# Patient Record
Sex: Male | Born: 1945 | Race: Black or African American | Hispanic: No | Marital: Married | Smoking: Never smoker
Health system: Southern US, Community
[De-identification: ages and names within clinical notes are randomized; demographics above are authoritative.]

## PROBLEM LIST (undated history)

## (undated) DIAGNOSIS — I10 Essential (primary) hypertension: Secondary | ICD-10-CM

## (undated) DIAGNOSIS — E119 Type 2 diabetes mellitus without complications: Secondary | ICD-10-CM

## (undated) HISTORY — PX: CORONARY ANGIOPLASTY WITH STENT PLACEMENT: SHX49

---

## 2003-04-01 ENCOUNTER — Ambulatory Visit (HOSPITAL_COMMUNITY): Admission: RE | Admit: 2003-04-01 | Discharge: 2003-04-01 | Payer: Self-pay | Admitting: Gastroenterology

## 2003-05-06 ENCOUNTER — Ambulatory Visit (HOSPITAL_COMMUNITY): Admission: RE | Admit: 2003-05-06 | Discharge: 2003-05-06 | Payer: Self-pay | Admitting: Internal Medicine

## 2003-05-14 ENCOUNTER — Encounter: Payer: Self-pay | Admitting: Cardiovascular Disease

## 2003-05-14 ENCOUNTER — Ambulatory Visit (HOSPITAL_COMMUNITY): Admission: RE | Admit: 2003-05-14 | Discharge: 2003-05-15 | Payer: Self-pay | Admitting: Cardiovascular Disease

## 2004-12-24 ENCOUNTER — Ambulatory Visit (HOSPITAL_COMMUNITY): Admission: RE | Admit: 2004-12-24 | Discharge: 2004-12-25 | Payer: Self-pay | Admitting: Cardiovascular Disease

## 2005-05-19 ENCOUNTER — Encounter: Admission: RE | Admit: 2005-05-19 | Discharge: 2005-05-19 | Payer: Self-pay | Admitting: Internal Medicine

## 2005-11-10 ENCOUNTER — Ambulatory Visit (HOSPITAL_BASED_OUTPATIENT_CLINIC_OR_DEPARTMENT_OTHER): Admission: RE | Admit: 2005-11-10 | Discharge: 2005-11-10 | Payer: Self-pay | Admitting: Urology

## 2005-11-10 ENCOUNTER — Ambulatory Visit (HOSPITAL_COMMUNITY): Admission: RE | Admit: 2005-11-10 | Discharge: 2005-11-10 | Payer: Self-pay | Admitting: Urology

## 2006-02-25 ENCOUNTER — Ambulatory Visit: Payer: Self-pay | Admitting: Internal Medicine

## 2008-04-05 ENCOUNTER — Ambulatory Visit: Payer: Self-pay | Admitting: Gastroenterology

## 2008-05-24 ENCOUNTER — Ambulatory Visit: Payer: Self-pay | Admitting: Gastroenterology

## 2008-05-24 ENCOUNTER — Encounter: Payer: Self-pay | Admitting: Gastroenterology

## 2008-05-29 ENCOUNTER — Encounter: Payer: Self-pay | Admitting: Gastroenterology

## 2009-09-02 ENCOUNTER — Encounter: Admission: RE | Admit: 2009-09-02 | Discharge: 2009-09-02 | Payer: Self-pay | Admitting: Urology

## 2009-09-03 ENCOUNTER — Ambulatory Visit (HOSPITAL_BASED_OUTPATIENT_CLINIC_OR_DEPARTMENT_OTHER): Admission: RE | Admit: 2009-09-03 | Discharge: 2009-09-04 | Payer: Self-pay | Admitting: Urology

## 2011-03-26 LAB — URINALYSIS, ROUTINE W REFLEX MICROSCOPIC
Bilirubin Urine: NEGATIVE
Glucose, UA: NEGATIVE mg/dL
Nitrite: NEGATIVE
Specific Gravity, Urine: 1.025 (ref 1.005–1.030)
pH: 5 (ref 5.0–8.0)

## 2011-03-26 LAB — CBC
MCV: 89.6 fL (ref 78.0–100.0)
Platelets: 299 10*3/uL (ref 150–400)
RBC: 4.36 MIL/uL (ref 4.22–5.81)
WBC: 6.2 10*3/uL (ref 4.0–10.5)

## 2011-03-26 LAB — COMPREHENSIVE METABOLIC PANEL
ALT: 16 U/L (ref 0–53)
AST: 32 U/L (ref 0–37)
Albumin: 4 g/dL (ref 3.5–5.2)
Chloride: 102 mEq/L (ref 96–112)
Creatinine, Ser: 1.35 mg/dL (ref 0.4–1.5)
GFR calc Af Amer: >60 mL/min (ref >60)
Sodium: 137 mEq/L (ref 135–145)
Total Bilirubin: 0.5 mg/dL (ref 0.3–1.2)

## 2011-03-26 LAB — GLUCOSE, CAPILLARY
Glucose-Capillary: 148 mg/dL — ABNORMAL HIGH (ref 70–99)
Glucose-Capillary: 115 mg/dL — ABNORMAL HIGH (ref 70–99)
Glucose-Capillary: 140 mg/dL — ABNORMAL HIGH (ref 70–99)

## 2011-03-26 LAB — APTT: aPTT: 28 seconds (ref 24–37)

## 2011-05-07 NOTE — Cardiovascular Report (Signed)
NAME:  GHASSAN, COGGESHALL NO.:  [id number]   MEDICAL RECORD NO.:  [id number]                   PATIENT TYPE:  OIB   LOCATION:  6531                                 FACILITY:  [facility]   PHYSICIAN:  Richard A. Alanda Amass, M.D.          DATE OF BIRTH:  Aug 31, 1946   DATE OF PROCEDURE:  05/14/2003  DATE OF DISCHARGE:                              CARDIAC CATHETERIZATION   PROCEDURE:  Retrograde central aorta catheterization, selective coronary  angiography by Judkins technique, IC nitroglycerin administration, LV  angiogram RAO/LAO projection, subselective LIMA/RIMA, abdominal angiogram  hand injection, weight adjustment heparin, Plavix 600 mg p.o., Aggrastat  bolus plus infusion, recannulization, chronic total, proximal codominant  circumflex stenosis, tandem stenting, mid circumflex segmental stenosis,  PTCA side branch large OM 2 stenosis.   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  He has a history of possible hyperlipidemia, past smoking abuse  for over 20 years, but quit 2004.  A long history dating back at least one  to two years or more of intermittent reflux system with pyrosis.  No GI  bleeding.  Recent colonoscopy with subsequent hemorrhoidectomy and polyp  removal and no GI bleeding.   The patient had an episode of epigastric and lower sternal chest discomfort  with mowing the lawn several weeks ago.  He felt that this was probably GI  related.  He stopped mowing and slowed down and then it recurred with some  mild shortness of breath.   He saw Erskine Speed, M.D. nonemergently and was concerned about this  exertional history.  The patient underwent outpatient treadmill exercise  testing with good functional capacity and no ST segment depression.  However, he had lower chest and epigastric discomfort post exercise.  For  this reason  with his history he was referred to [place] for further evaluation.  Treadmill was done May 06, 2003.  A called for patient and saw him in the  office on May 06, 2003.  He was not having any chest discomfort and felt  good.  We are concerned about his history.  I recommended the patient  undergo diagnostic cardiac catheterization.  He was begun on Lopressor,  continued on aspirin, and started on Nexium.  Because of home and work  considerations the patient declined admission that day or last week for  angiography and it was scheduled for today.  He was able to work but we had  him at otherwise limited activity and he knew to call Dr. Chilton Si and [place] if  there were any symptoms.  He did well, was admitted at the same day  admission, today.   He was brought to the second floor CP laboratory in postabsorptive state  after 5 mg of Valium p.o. pre medication.  The right groin was prepped,  draped in the  usual manner and the CRLFA was entered with single anterior  puncture using an 18 thin wall needle with a 6-French short-based slide-on  sheath inserted without difficulty.  Diagnostic coronary angiography was  done with 6-French 4 cm taper of preform coronary and pigtail catheters  using Omnipaque dye throughout the procedure.  IC nitroglycerin was  administered with repeat left coronary injections __________.  LV angiogram  was done in the RAO and LAO projection 25 mL 14 mL per second, 20 mL 12  mL/second, respectively.  Pullback pressure of the CA was performed and  shows no gradient across the aortic valve.  Subselective LIMA and RIMA were  done by hand injection with the right coronary catheter showing patent RIMA,  patent LIMA, antegrade vertebral flow bilaterally, and no subclavian or  brachiocephalic stenosis.   Abdominal angiogram was done by hand through the pigtail catheter above the  level of the renal arteries demonstrating single normal appearing renal  arteries bilaterally and no  significant infrarenal atherosclerotic disease.   Arterial pressures were monitored throughout the procedure and were  approximately 150-160 mmHg.  Post IC nitroglycerin pressures came down.   The patient tolerated the diagnostic procedure well.   Pressures:  1. LV 145/0; LVEDP 16-18 mmHg.  2. CA:  145/80 mmHg.   There is no gradient across the aortic valve on catheter pullback.   Fluoroscopy showed some minor calcification of the proximal RCA and proximal  circumflex +1.  There was no intracardiac or valvular calcification.   LV angiogram in the RAO and LAO projection showed hypokinesis of the mid  anterolateral wall, akinesis of a small area of the mid inferior wall, and  hypokinesis of the basilar inferior wall.  There was hypo/akinesis of the  posteroapical segment.  There was no mitral regurgitation and estimated  ejection fraction was approximately 45%.   The main left coronary was normal.   The left anterior descending had smooth 30-40% narrowing in proximal third  just at the level of the moderately large first diagonal branch and proximal  to it.  This appeared noncritical on multiple views.   The remainder of the LAD was widely patent, coursed to the apex of the heart  where it bifurcated.   The first diagonal that rose from the junction of the proximal third of the  LAD before SP1 and had a 70% ostial lesion with good flow.  It was  moderately large and bifurcated.   The second diagonal rose from the mid LAD, was after the large SP2,  trifurcating, had moderate size, and had no significant stenosis.   There was a normal third diagonal from the mid distal third of the LAD.   The right coronary was a codominant vessel giving off the PDA and several  branches distally, but no PLA.  There was 40% narrowing segmentally at the  junction of the proximal third, another 40% at the acute margin.  There was good flow throughout.  There were collaterals to the circumflex  artery from  the distal RCA faintly filling the distal marginal and PLA branches.   Circumflex artery gave off a large first marginal that had no significant  stenosis and arose very proximally and was almost a ramus distribution.   The circumflex was then abruptly occluded at the junction of the proximal  third with essentially no antegrade flow on diagnostic study.  There was no  tapering or nubbing and there appeared to be mild calcification and some  residual dye  staining, but no obvious thrombus.   It appears that this patient's symptoms are related to total occlusion of  the circumflex. It is not possible to say whether this is recent as  suggested by history over the last three weeks or whether it is more  chronic.  He does have wall motion abnormality.  There is noncritical LAD,  RCA disease, and borderline ostial DX1 narrowing.  He is a candidate for  attempted recannulization of his circumflex artery in this setting.  He is  65 years old and has not had primary medical therapy for CAD or  hyperlipidemia as yet.   Informed consent was obtained to proceed with intervention.   The patient was given weight adjusted heparin, monitoring ACTs, was started  on Aggrastat bolus plus infusion, and was given 300 mg of platelets x2  (total 600 mg) in the laboratory.  IC nitroglycerin was administered, a  total of 400 mcg.  He was given 2 mg of Nubain for sedation and 3 mg of  Versed in divided doses.   Injection of the left coronary showed some faint antegrade filling but there  was a large segmental area of complete occlusion of the circumflex artery  with no obvious thrombus.   The left coronary was initially intubated with a JL4 guiding catheter.  However, this had poor seating and it was then changed for a 6-French CLS  3.5 guiding catheter.  Initially, an [employer]ahi light guidewire 0.014 was  utilized but with this we were not able to cross the total occlusion even  with 2.0 mm  balloon backup.  We then used a 0.014 inch ACS Cross-It wire  with a 0.010 taper number 100.  Using this wire we were able to end the 2.0  CrossSail ACS balloon.  I was able to cross the segment of chronic appearing  total occlusion.  The wire was then free beyond the OM 2 branch.  We were  able to dilate the 2.0 balloon in several areas of the total occlusion at 6-  34 and 7-28 and 11-41.   We did restore faint antegrade flow and there was segmental disease with  localized dissection past the OM 2 which was patent.  The guidewire had  slipped back at that time.  It was then passed into the OM 2 branch and  using double wire technique a new ACS Whisper 0.014 inch wire was used to  recross into the distal dominant circumflex.  Using double wire technique we  then upgraded to the 2.5 Maverick monorail balloon and dilated carefully  across the chronic dissection and beyond the OM 2 with 4-14 and 5-31.  We then stented just beyond the OM 2 in the mid circumflex with a 2.5/13  quarter Cypher stent.  This was positioned just beyond the OM 2.  This was  deployed at 12-59, post dilated to 14-38.   On injection there was obviously some stenosis of the ostium at the OM 2.  At this point since the double wire was in place we redilated the ostium at  the OM 2 with the 2.5 Maverick balloon at 6 atmospheres.  The balloon was  pulled back.  Flow was good to the OM 2 but it was clear that we had the  stent across the OM 2 origin to cover the lesion.  This was done with an  overlapping 2.5/28 mm Cypher stent.  It was positioned overlapping the  previously placed stent distally and covered the lesion  at the proximal  third of the vessel.  It was deployed at 15 atmospheres for 32 seconds.  There was good flow throughout the circumflex with 80% or greater ostial  narrowing of the OM 2.  We then upgraded the balloon to a 3.0/20 Quantum  Maverick and dilated within the overlapping stents up to the distal  third of  the stent to 12-48 and 12-35.  The balloon was pulled back (the OM 2 wire  had been pulled back prior to stent deployment).   I then pulled back the Whisper wire and used that to cross into the OM 2  through the stent.  I then crossed through the stent with a 2.5 Maverick  balloon and dilated the ostium of the OM 2 at 8 atmospheres for 40 seconds.  The balloon was then pulled back after IC nitroglycerin.  Final injections  demonstrated excellent result with the circumflex stenosis reduced from  segmental 100 to 0 with good flow and no dissection.  The side branch OM 2  ostia was reduced from 80% to less than 10% with good flow in several  projections.   Dilatation system was removed.  Side arm sheath was flushed.  The final ACT  was 241 seconds.  The patient received 300 mg of Plavix x2 in the  laboratory.  Will continue Aggrastat infusion for approximately 18 hours.  Continue aspirin and Plavix and aggressive therapy of his lipids.  His pre  admission cholesterol was 253 and LDL 197, HDL 40.  He is started on  atorvastatin 80 daily at present (Prove-IT trial doses).   IMPRESSION AND PLAN:  The patient has noncritical disease of his left  anterior descending, borderline disease of his DX1 and I would recommend  continued medical therapy and medical and cardiac follow-up.   The angiographic appearance and difficulty crossing the chronic total  occlusion suggested that this was chronic and possibly longer than three  weeks ago as suggested by his history.   CATHETERIZATION DIAGNOSES:  1. Arteriosclerotic heart disease, recent onset of exertional chest pain and     epigastric lower substernal compatible with ischemia.  2. Positive treadmill exercise test May 06, 2003 by symptoms without acute     EKG changes, Erskine Speed, M.D.  3. Total occlusion proximal circumflex with faint collaterals from right    coronary artery and noncritical left anterior descending/right  coronary     artery at diagnostic catheterization May 14, 2003.  4. Successful recannulization tandem stenting mid circumflex segmental     stenosis and successful side branch obtuse marginal 2 PTCA.  5. Hyperlipidemia.  6. History of gastroesophageal reflux disease.  7. Tobacco abuse, cigarette cessation February 2004.  8. Recent colonoscopy with hemorrhoidectomy and polyp removal.  9. Mild hypertension.  Normal renal arteries.                                               Richard A. Alanda Amass, M.D.    RAW/MEDQ  D:  05/14/2003  T:  05/14/2003  Job:  [id number]   cc:   Erskine Speed, M.D.  [address], [address]  [place]  [place] [address]  Fax: [phone]   [facility]   Nanetta Batty, M.D.  [address]  [place]  [place] [address]  Fax: [phone]

## 2011-05-07 NOTE — Discharge Summary (Signed)
NAME:  Albert Keller, Albert Keller NO.:  [id number]   MEDICAL RECORD NO.:  [id number]          PATIENT TYPE:  OIB   LOCATION:  6525                         FACILITY:  [facility]   PHYSICIAN:  Richard A. Alanda Amass, M.D.DATE OF BIRTH:  1946/09/25   DATE OF ADMISSION:  12/24/2004  DATE OF DISCHARGE:  12/25/2004                                 DISCHARGE SUMMARY   Mr. [name] is a 65 year old African-American married male patient of  Dr. Alanda Amass who came into the hospital for elective cardiac  catheterization. Previously, he had had two episodes of shortness of breath  and diaphoresis but no chest pain. He underwent Cardiolite testing. It  showed lateral ischemia. He does have a history of prior coronary artery  disease with two tandem stents placed in May 2004 in his circumflex artery.  It was felt that he possibly had restenosis so he came into undergo cardiac  catheterization. This was performed by Dr. Susa Griffins. He was found  to have 85% in-stent restenosis of his circumflex stents. He subsequently  then underwent PCI and stent placement. He had two CYPHER stents placed 3.0  x 13 in his circumflex within his previous stents. The morning of December 25, 2004, he was seen by Dr. Tresa Endo and also Dr. Alanda Amass. He was considered for  discharge home. His blood pressure was 114/58. His hemoglobin was 13.6,  hematocrit 39.6, his platelets 300, WBC 6.4. His sodium was 140, potassium  3.8, BUN 7, creatinine 1.2. CK-MB was 249/2.0. His chest x-ray showed no  acute disease. His temperature was minimally elevated at 99.3. It was  discussed with him about therapeutic lifestyle changes with him and his  wife.   DISCHARGE MEDICATIONS:  1.  Aspirin 81 mg one time per day.  2.  Ticlid 250 mg two times per day with a meal for 6 months. He is not to      stop it.  3.  Protonix 40 mg two times per day.  4.  Lipitor 80 mg one time per day.  5.  Toprol-XL 25 mg one time per day.  6.   Nitroglycerin one under tongue every 5 minutes x3 for chest pain.   ACTIVITY:  He should do no strenuous activity, lifting, pushing, pulling x5  days. No driving x1 day. He should wait for 1 week to exercise.   FOLLOWUP:  He should have his blood drawn in 2 weeks and then every month as  long as he is on Ticlid. He will follow up with Dr. Alanda Amass in 3 to 4  weeks. He should have CBCs drawn as stated above secondary to his being on  the Ticlid.   DISCHARGE DIAGNOSIS:  1.  Progressive coronary artery disease status post cardiac catheterization      with previous two episodes of shortness of breath and diaphoresis with a      positive Cardiolite.  2.  Status post catheterization with in-stent restenosis of his circumflex.      He received two CYPHER stents as described above.  3.  Normal ejection fraction.  4.  Hyperlipidemia.  5.  Skin disorder, being evaluated for Agent Orange exposure.  6.  Gastroesophageal reflux disease.      Beve   BB/MEDQ  D:  12/25/2004  T:  12/25/2004  Job:  [id number]   cc:   Erskine Speed, M.D.  [address]  [place]  [place] [address]  Fax: [phone]

## 2011-05-07 NOTE — Op Note (Signed)
NAME:  Albert Keller, Albert Keller NO.:  [id number]   MEDICAL RECORD NO.:  [id number]          PATIENT TYPE:  AMB   LOCATION:  NESC                         FACILITY:  [facility]   PHYSICIAN:  Ronald L. Earlene Plater, M.D.  DATE OF BIRTH:  09/27/1946   DATE OF PROCEDURE:  11/10/2005  DATE OF DISCHARGE:                                 OPERATIVE REPORT   PREOPERATIVE DIAGNOSES:  Balanitis, phimosis.   POSTOPERATIVE DIAGNOSES:  Balanitis, phimosis.   PROCEDURE:  Circumcision.   SURGEON:  Lucrezia Starch. Earlene Plater, M.D.   ANESTHESIA:  LMA.   ESTIMATED BLOOD LOSS:  Negligible.   TUBES:  None.   COMPLICATIONS:  None.   INDICATIONS FOR PROCEDURE:  [name] is a very nice 65 year old black male  who essentially presented with difficulty of cracking and splitting of the  foreskin. He has deep urinary flow but it is sometimes irritating and  painful and on examination it was felt to be significant healed balanitis  with phimosis. 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 Chilton Si for that. After understanding the risks, benefits, and  alternatives, he has elected to proceed with circumcision.   DESCRIPTION OF PROCEDURE:  The patient was placed in supine position and  after proper LMA anesthesia was prepped and draped with Betadine in a  sterile fashion. The foreskin could not be retracted. A dorsal slit was  performed with bovie coagulation cautery and the glans was thoroughly  prepped also with Betadine. A circumferential incision was made in the shaft  skin and a second incision approximately 1 mm proximal to the corona areata  and incisions were carried down to bucks fascia the corpus spongiosum. The  foreskin was then excised utilizing both blunt and sharp dissection and  bovie coagulation cautery. Good hemostasis was noted to be present, thorough  irrigation was performed. The shaft skin was approximated to the mucosa with  3-0 chromic catgut. A dorsal stitch was  placed and a ventral U type stitch  was placed and each was run along its corresponding lateral side. There was  a tiny frenular area that was protuberant so frenuloplasty was performed  with a horizontal mattress  3-0 chromic catgut. Good hemostasis was noted to be present. The wound was  cleaned sterilely, dressed with Vaseline gauze, 4 x 4, and a coban. He  tolerated the procedure well and was taken to the recovery room stable. The  foreskin was submitted to pathology.      Ronald L. Earlene Plater, M.D.  Electronically Signed     RLD/MEDQ  D:  11/10/2005  T:  11/10/2005  Job:  [id number]

## 2011-05-07 NOTE — Discharge Summary (Signed)
NAME:  Albert Keller, Albert Keller NO.:  [id number]   MEDICAL RECORD NO.:  [id number]                   PATIENT TYPE:  OIB   LOCATION:  6531                                 FACILITY:  [facility]   PHYSICIAN:  Raymon Mutton, P.A.              DATE OF BIRTH:  [birth year]   DATE OF ADMISSION:  05/14/2003  DATE OF DISCHARGE:  05/15/2003                                 DISCHARGE SUMMARY   DISCHARGE DIAGNOSES:  1. Coronary artery disease status post left coronary angiography with     intervention to the circumflex artery this admission by Dr. Alanda Amass.  2. Hyperlipidemia with elevated LDL of 197.  Started on Lipitor this     admission.  3. Recent tobacco cessation.  4. Gastroesophageal reflux disease/indigestion.  5. Borderline low potassium repleted.   HISTORY OF PRESENT ILLNESS:  Albert Keller is a 65 year old gentleman who  presented to our office on 05/06/03 with complaints of chest pain.  He  underwent a treadmill stress test at [facility] by Dr. Nila Nephew  that revealed no EKG changes, but the patient developed symptoms in  recovery, so was referred to the cardiologist.  His EKG in our office post-  treadmill test revealed symmetric T-wave inversion in leads II, III, and  aVF, which were quite pronounced, but previously they were only in leads III  and aVF.  We started the patient on Toprol and Nexium, and Dr. Alanda Amass  made a decision to undergo cardiac catheterization, which was scheduled for  05/14/03.   The patient was admitted to the short stay unit for elective cath.  Cath was  performed on 05/14/03 and revealed high-grade stenosis of the mid-circumflex  artery.  Angioplasty and stenting of the large proximal to midportion of the  circumflex and also angioplasty of the obtuse marginal was performed.  The  patient tolerated the procedure well, and did not have any complications.  He was given Aggrastat for 18 hours and was transferred to the  telemetry  unit in stable condition.   The next morning he was assessed by Dr. Alanda Amass.  His lipid profile showed  LDL 197.  He was started on Lipitor 80 mg daily.  His BMP revealed potassium  of 3.5.  He was given 40 mEq of potassium ___________ discharge.  His BUN  was 8, creatinine 1.3, hemoglobin 15.0, hematocrit 38.3.  Enzymes with  elevated troponin 0.55, but MB and CK were within normal limits.   The patient was stable from a cardiovascular standpoint, free of chest pain.  His blood pressure was 132/85, pulse 70, oxygen saturation 99% on room air.  He was discharged home with the following recommendations.   RECOMMENDATIONS:  1. Avoid driving, heavy lifting greater than five pounds, strenuous physical     activity for three days post catheterization.  2. Stay on low-fat, low-cholesterol diet.  3. Is allowed to shower.  Instructed not to rub the wound puncture site, but     to pat it dry.   The patient will need a lipid profile follow up in 6-8 weeks after  initiation of Lipitor therapy.   DISCHARGE MEDICATIONS:  1. Aspirin 81 mg daily.  2. Plavix 75 mg daily.  3. Lipitor 18 mg daily.  4. Nexium 40 mg daily.  5. Toprol-XL 50 mg daily.   FOLLOW UP:  Follow up appointment scheduled with Dr. Alanda Amass on 06/07/03 at  1:45 p.m.                                               Raymon Mutton, P.A.    MK/MEDQ  D:  05/15/2003  T:  05/15/2003  Job:  [id number]   cc:   Gerlene Burdock A. Alanda Amass, M.D.  [phone] [address]  [place]  [place] [address]  Fax: [phone]   Erskine Speed, M.D.  [address]  [place]  [place] [address]  Fax: [phone]

## 2011-05-07 NOTE — Cardiovascular Report (Signed)
NAME:  Albert Keller, Albert Keller NO.:  [id number]   MEDICAL RECORD NO.:  [id number]          PATIENT TYPE:  OIB   LOCATION:  2899                         FACILITY:  [facility]   PHYSICIAN:  Richard A. Alanda Amass, M.D.DATE OF BIRTH:  1946/12/04   DATE OF PROCEDURE:  12/24/2004  DATE OF DISCHARGE:                              CARDIAC CATHETERIZATION   PROCEDURE:  Retrograde central aortic catheterization, selective coronary  angiography by Judkins' technique, IC nitroglycerin administration, LV  angiogram, RAO, LAO projection, sub selective left internal mammary artery,  hand injection, abdominal aortic angiogram, mid stream PA projection, weight-  adjusted heparin, Aggrastat bolus plus infusion, Ticlid loading dose  continued and additional baby aspirin, Aggrastat double bolus plus infusion,  cutting balloon atherectomy, high grade mid ISR and previously placed  overlapping Cypher stents (May 14, 2003), side branch subsequent overlapping  VEX Cypher stent, side branch percutaneous transluminal coronary  angioplasty, obtuse marginal 2 for plaque shift.   DESCRIPTION OF PROCEDURE:  Please refer to history and physical.   Albert Keller history is well outlined.  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  He was a smoker  up until 2004 when he underwent percutaneous coronary intervention.  This  was prompted by new exertional chest pain associated with positive treadmill  for symptoms without electrocardiogram changes done by Dr. Chilton Si.  On May 14, 2003 he underwent diagnostic catheterization and intervention for a  totally occluded circumflex presumably out of hospital silent PMI.  He had  successful recanalization and overlapping 2.5/28 and 2.5/13 Cypher stents  across the obtuse marginal 2 and mid circumflex.  The stents were re dilated  with 3.0 balloon at  high pressures and the obtuse marginal 2 side branch,  which had 80% stenosis with plaque shift was dilated with a 2.5 balloon. He  did well as an outpatient up until the last several months when he had two  episodes of shortness of breath and diaphoresis that were exertionally  related.  He underwent Cardiolite stress testing which was positive for  ischemia on November 09, 2004 prompting this catheterization.  Of note is  the fact that he is ALLERGIC to PLAVIX with a rash after his last  intervention and he was switched to Ticlid which he tolerated well except  for some gastrointestinal symptoms and was continued on that for six months  after stent placement.   Informed consent was obtained to proceed with catheterization and  intervention, if necessary. The patient was brought to the second floor CP  lab in a post-absorptive state after 5 mg of Valium p.o. premedication.  The  right groin was prepped and draped in usual manner and 1% Xylocaine was used  for local anesthesia.  The CF RA was entered with a single anterior puncture  using an 18 thin-walled needle and a #6 [place] short Daig side arm sheath  was inserted without difficulty.  Diagnostic coronary angiography was done  pre and post IC nitroglycerin administration with #  6 French 4 cm tapered  Cordis preform coronary catheters. Sub selective LIMA revealed a widely  patent left internal mammary artery, antegrade left vertebral and normal  left subclavian on hand injection.  LV angiogram in the RAO and LAO  projections through a pigtail #6 French catheter, 25 cc, 14 cc per second,  20 cc, 12 cc per second.  Pull back pressure to CA showed no gradient across  the aortic valve.  Abdominal aortic angiogram was done above the level of  the renal arteries.  This revealed patent celiac and SMA axis proximally,  normal single renal arteries bilaterally and mild infrarenal atherosclerotic  disease with no significant stenosis or aneurysm  formation and patent  proximal iliac's with good runoff.   PRESSURES:  LV:  140/0; LV EDP 18 mmHg.  CA:  140/80 mmHg.   There is no gradient between LV and CA on catheter pull back.   LV angiogram revealed hypokinesis of basilar quarter of the inferior wall  and the apical segment in the LAO projection.  There was no mitral  regurgitation.  There was angiographic left ventricular hypertrophy and well  preserved ejection fraction at greater than 55%.   Fluoroscopy showed the previously-placed mid circumflex stents.  There was  1+ calcification of the left and right coronary system.  There was no  significant intracardiac or valvular calcification.   The main left coronary was normal.   The left anterior descending had smooth, mildly eccentric 30% narrowing in  the proximal third just before the first diagonal.  There was good residual  lumen.   The remainder of the left anterior descending was widely patent, coursed to  the apex of the heart where it bifurcated.   The large first diagonal had 60 to 70% narrowing in its ostia with good flow  and this was essentially unchanged.  It is a moderate sized vessel that  bifurcated.   The second diagonal rose from the mid left anterior descending, had 40%  ostial narrowing and trifurcated distally and was of moderate size.   The circumflex artery gave off a large OM1 that had irregularities in the  proximal third but with significant stenosis and this occurred very  proximally.  In the mid circumflex we could see the overlapping stents and  there was 85% ISR just beyond the overlap and just distal and abutting to  the origin of the obtuse marginal 2.  The obtuse marginal 2 bifurcated, had  mild narrowing at the ostia, was moderately large and tortuous.   The distal circumflex gave off  a PLA and a large marginal branch and was a  co-dominant vessel.  The right coronary artery was a co-dominant vessel of moderate size and had  40%  smooth narrowing in the proximal third segmentally, 30% in the mid  portion.  It was comprised mainly of a long PDA that had no significant  stenosis with PLA arising from the circumflex.   In view of the patient's in-stent restenosis, positive Cardiolite and  symptoms it was elected to proceed with percutaneous coronary intervention  in this setting.   The patient was given 500 mg of Ticlid in the laboratory and an additional 4  baby aspirin.  Patient was given double bolus Aggrastat plus infusion and  weight-adjusted heparin totally 7,000 units, monitoring ACT's throughout the  procedure.  The left coronary was intubated with a JL 3.5 Cordis #6 [place]  guiding catheter.  The lesion was crossed with an Asahi soft  wire.  Initial  attempts at crossing with the 2.5 cutting balloon were unsuccessful because  of tortuosity in the proximal circumflex and relatively poor back up.  A  second buddy wire was used which was an Asahi 0.014 extra support pro water  wire.  On passing the cutting balloon initially it would not pass but after  removing the soft wire I was able to cross with the cutting balloon.  The in-  stent restenosis across the obtuse marginal 2 was dilated with a 2.5/10  __________ cutting balloon at 8/25, 10/35 and 14/40.  The balloon was pulled  back.  There was residual haziness and some elastic recoil and it was  elected to do a sandwich stent in this area.  Initial attempts to cross with  a 3.0/13 Cypher DES stent were not successful for similar technical reasons.  At that point it was elected to abandon the current system and this was  removed.  The left coronary was then re intubated with a #6 [place] XB 3.0  Cordis guiding catheter and the lesion was crossed with a 0.014 inch Asahi  pro water wire which was free in the distal vessel posteriorly.  We were  then able to cross with the 3.0/13 Cypher stent which was deployed under  fluoroscopic control at 10/25 and re dilated at  12/40.  Injections following  this in IC nitroglycerin showed plaque shift into the obtuse marginal 2 with  90 to 90% ostial stenosis.  This was then crossed with a second Asahi soft  0.014 inch wire into the side branch through the overlapping stent struts.  The lesion was crossed in the ostia with a 2.5/8 Guidant voyager balloon and  the ostia dilated at 8/30 and post dilated at 13/45.  The dilatation system  was removed.  The main circumflex wire was removed and final injections post  IC nitroglycerin showed excellent angiographic result.  The stent in the mid  circumflex was reduced from 85% to 0%.  The side branch obtuse marginal 2  was reduced from greater than 90% to less than 20%.  There was good TIMI 3  flow in both vessels and throughout the distal circumflex.  The side arm  sheath was flushed and secured to the skin to prevent migration.  Final ACT was 282 seconds.  The patient tolerated the procedure well and was  transferred to the holding area for postoperative care.  He will be  continued on Ticlid because of ALLERGY to PLAVIX, aspirin and Aggrastat for  18 hours.  Continued medical therapy and cardiac surveillance.   CATHETERIZATION DIAGNOSES:  1.  Arteriosclerotic heart disease, status post out of hospital posterior      myocardial infarction, 2004.  Subsequent positive treadmill,      catheterization and circumflex recanalization with tandem DES 2.5      stents, upgraded to 3.0 balloon dilatation May 14, 2003 and obtuse      marginal 2 side branch percutaneous transluminal coronary angioplasty.  2.  Recurrent symptoms compatible with ischemia and positive Cardiolite      November 2005 prompting re-catheterization.  3.  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      Percutaneous transluminal coronary angioplasty May 14, 2003.  4.  Hyperlipidemia on therapy.  5.  Gastroesophageal  reflux disease.  6.  Chronic skin condition, possibly related to agent orange exposure      remotely.  7.  Systemic hypertension, normal renal arteries.  8.  Remote smoker, quit 2004.      Rich   RAW/MEDQ  D:  12/24/2004  T:  12/24/2004  Job:  [id number]   cc:   Erskine Speed, M.D.  [address], [address]  [place]  [place] [address]  Fax: [phone]   CP Lab

## 2014-09-30 ENCOUNTER — Encounter: Payer: Self-pay | Admitting: Gastroenterology

## 2015-02-18 ENCOUNTER — Other Ambulatory Visit (HOSPITAL_COMMUNITY): Payer: Self-pay | Admitting: Internal Medicine

## 2015-02-18 DIAGNOSIS — Z0000 Encounter for general adult medical examination without abnormal findings: Secondary | ICD-10-CM | POA: Diagnosis not present

## 2015-02-18 DIAGNOSIS — I2510 Atherosclerotic heart disease of native coronary artery without angina pectoris: Secondary | ICD-10-CM | POA: Diagnosis not present

## 2015-02-18 DIAGNOSIS — E1136 Type 2 diabetes mellitus with diabetic cataract: Secondary | ICD-10-CM | POA: Diagnosis not present

## 2015-02-18 DIAGNOSIS — E039 Hypothyroidism, unspecified: Secondary | ICD-10-CM | POA: Diagnosis not present

## 2015-02-18 DIAGNOSIS — D559 Anemia due to enzyme disorder, unspecified: Secondary | ICD-10-CM | POA: Diagnosis not present

## 2015-02-18 DIAGNOSIS — Z6831 Body mass index (BMI) 31.0-31.9, adult: Secondary | ICD-10-CM | POA: Diagnosis not present

## 2015-02-18 DIAGNOSIS — E118 Type 2 diabetes mellitus with unspecified complications: Secondary | ICD-10-CM | POA: Diagnosis not present

## 2015-02-18 DIAGNOSIS — D291 Benign neoplasm of prostate: Secondary | ICD-10-CM | POA: Diagnosis not present

## 2015-02-18 DIAGNOSIS — E780 Pure hypercholesterolemia: Secondary | ICD-10-CM | POA: Diagnosis not present

## 2015-02-24 ENCOUNTER — Ambulatory Visit (HOSPITAL_COMMUNITY): Admission: RE | Admit: 2015-02-24 | Payer: Medicare Other | Source: Ambulatory Visit

## 2015-03-19 ENCOUNTER — Other Ambulatory Visit (HOSPITAL_COMMUNITY): Payer: Self-pay | Admitting: Internal Medicine

## 2015-03-19 DIAGNOSIS — I517 Cardiomegaly: Secondary | ICD-10-CM

## 2015-03-19 DIAGNOSIS — R9431 Abnormal electrocardiogram [ECG] [EKG]: Secondary | ICD-10-CM

## 2015-03-21 ENCOUNTER — Ambulatory Visit (HOSPITAL_COMMUNITY)
Admission: RE | Admit: 2015-03-21 | Discharge: 2015-03-21 | Disposition: A | Discharge status: Home / Self Care | Payer: Medicare Other | Source: Ambulatory Visit | Attending: Physician Assistant | Admitting: Physician Assistant

## 2015-03-21 DIAGNOSIS — R9431 Abnormal electrocardiogram [ECG] [EKG]: Secondary | ICD-10-CM | POA: Insufficient documentation

## 2015-03-21 DIAGNOSIS — I517 Cardiomegaly: Secondary | ICD-10-CM | POA: Diagnosis not present

## 2015-03-21 NOTE — Progress Notes (Signed)
  Echocardiogram 2D Echocardiogram has been performed.  Donata Clay 03/21/2015, 10:52 AM

## 2015-04-01 DIAGNOSIS — E1136 Type 2 diabetes mellitus with diabetic cataract: Secondary | ICD-10-CM | POA: Diagnosis not present

## 2015-04-01 DIAGNOSIS — I2510 Atherosclerotic heart disease of native coronary artery without angina pectoris: Secondary | ICD-10-CM | POA: Diagnosis not present

## 2015-06-30 DIAGNOSIS — I10 Essential (primary) hypertension: Secondary | ICD-10-CM | POA: Diagnosis not present

## 2015-06-30 DIAGNOSIS — E1136 Type 2 diabetes mellitus with diabetic cataract: Secondary | ICD-10-CM | POA: Diagnosis not present

## 2015-06-30 DIAGNOSIS — E118 Type 2 diabetes mellitus with unspecified complications: Secondary | ICD-10-CM | POA: Diagnosis not present

## 2015-06-30 DIAGNOSIS — E780 Pure hypercholesterolemia: Secondary | ICD-10-CM | POA: Diagnosis not present

## 2016-10-31 ENCOUNTER — Inpatient Hospital Stay (HOSPITAL_COMMUNITY): Payer: Medicare Other

## 2016-10-31 ENCOUNTER — Inpatient Hospital Stay (HOSPITAL_COMMUNITY)
Admission: EM | Admit: 2016-10-31 | Discharge: 2016-11-19 | DRG: 871 | Disposition: E | Payer: Medicare Other | Attending: Pulmonary Disease | Admitting: Pulmonary Disease

## 2016-10-31 ENCOUNTER — Emergency Department (HOSPITAL_COMMUNITY): Payer: Medicare Other

## 2016-10-31 ENCOUNTER — Encounter (HOSPITAL_COMMUNITY): Payer: Self-pay

## 2016-10-31 DIAGNOSIS — R6521 Severe sepsis with septic shock: Secondary | ICD-10-CM | POA: Diagnosis present

## 2016-10-31 DIAGNOSIS — I2510 Atherosclerotic heart disease of native coronary artery without angina pectoris: Secondary | ICD-10-CM | POA: Diagnosis present

## 2016-10-31 DIAGNOSIS — R34 Anuria and oliguria: Secondary | ICD-10-CM | POA: Diagnosis not present

## 2016-10-31 DIAGNOSIS — IMO0002 Reserved for concepts with insufficient information to code with codable children: Secondary | ICD-10-CM | POA: Diagnosis present

## 2016-10-31 DIAGNOSIS — I130 Hypertensive heart and chronic kidney disease with heart failure and stage 1 through stage 4 chronic kidney disease, or unspecified chronic kidney disease: Secondary | ICD-10-CM | POA: Diagnosis present

## 2016-10-31 DIAGNOSIS — Z79899 Other long term (current) drug therapy: Secondary | ICD-10-CM

## 2016-10-31 DIAGNOSIS — E1165 Type 2 diabetes mellitus with hyperglycemia: Secondary | ICD-10-CM | POA: Diagnosis present

## 2016-10-31 DIAGNOSIS — I472 Ventricular tachycardia: Secondary | ICD-10-CM | POA: Diagnosis not present

## 2016-10-31 DIAGNOSIS — E039 Hypothyroidism, unspecified: Secondary | ICD-10-CM | POA: Diagnosis present

## 2016-10-31 DIAGNOSIS — N183 Chronic kidney disease, stage 3 unspecified: Secondary | ICD-10-CM | POA: Diagnosis present

## 2016-10-31 DIAGNOSIS — J9601 Acute respiratory failure with hypoxia: Secondary | ICD-10-CM

## 2016-10-31 DIAGNOSIS — I4901 Ventricular fibrillation: Secondary | ICD-10-CM | POA: Diagnosis not present

## 2016-10-31 DIAGNOSIS — N179 Acute kidney failure, unspecified: Secondary | ICD-10-CM | POA: Diagnosis present

## 2016-10-31 DIAGNOSIS — I5043 Acute on chronic combined systolic (congestive) and diastolic (congestive) heart failure: Secondary | ICD-10-CM | POA: Diagnosis present

## 2016-10-31 DIAGNOSIS — K8591 Acute pancreatitis with uninfected necrosis, unspecified: Secondary | ICD-10-CM | POA: Diagnosis present

## 2016-10-31 DIAGNOSIS — E876 Hypokalemia: Secondary | ICD-10-CM | POA: Diagnosis not present

## 2016-10-31 DIAGNOSIS — Z955 Presence of coronary angioplasty implant and graft: Secondary | ICD-10-CM

## 2016-10-31 DIAGNOSIS — J80 Acute respiratory distress syndrome: Secondary | ICD-10-CM | POA: Diagnosis present

## 2016-10-31 DIAGNOSIS — I5023 Acute on chronic systolic (congestive) heart failure: Secondary | ICD-10-CM | POA: Diagnosis present

## 2016-10-31 DIAGNOSIS — Z7984 Long term (current) use of oral hypoglycemic drugs: Secondary | ICD-10-CM

## 2016-10-31 DIAGNOSIS — E872 Acidosis: Secondary | ICD-10-CM | POA: Diagnosis present

## 2016-10-31 DIAGNOSIS — R079 Chest pain, unspecified: Secondary | ICD-10-CM

## 2016-10-31 DIAGNOSIS — E785 Hyperlipidemia, unspecified: Secondary | ICD-10-CM | POA: Diagnosis present

## 2016-10-31 DIAGNOSIS — K859 Acute pancreatitis without necrosis or infection, unspecified: Secondary | ICD-10-CM

## 2016-10-31 DIAGNOSIS — Z9289 Personal history of other medical treatment: Secondary | ICD-10-CM

## 2016-10-31 DIAGNOSIS — R4182 Altered mental status, unspecified: Secondary | ICD-10-CM | POA: Diagnosis present

## 2016-10-31 DIAGNOSIS — R740 Nonspecific elevation of levels of transaminase and lactic acid dehydrogenase [LDH]: Secondary | ICD-10-CM | POA: Diagnosis present

## 2016-10-31 DIAGNOSIS — E1122 Type 2 diabetes mellitus with diabetic chronic kidney disease: Secondary | ICD-10-CM | POA: Diagnosis present

## 2016-10-31 DIAGNOSIS — I340 Nonrheumatic mitral (valve) insufficiency: Secondary | ICD-10-CM | POA: Diagnosis present

## 2016-10-31 DIAGNOSIS — E875 Hyperkalemia: Secondary | ICD-10-CM | POA: Diagnosis present

## 2016-10-31 DIAGNOSIS — R7401 Elevation of levels of liver transaminase levels: Secondary | ICD-10-CM | POA: Diagnosis present

## 2016-10-31 DIAGNOSIS — R0682 Tachypnea, not elsewhere classified: Secondary | ICD-10-CM

## 2016-10-31 DIAGNOSIS — I5189 Other ill-defined heart diseases: Secondary | ICD-10-CM | POA: Diagnosis present

## 2016-10-31 DIAGNOSIS — A419 Sepsis, unspecified organism: Principal | ICD-10-CM | POA: Diagnosis present

## 2016-10-31 DIAGNOSIS — D649 Anemia, unspecified: Secondary | ICD-10-CM | POA: Diagnosis present

## 2016-10-31 DIAGNOSIS — Z888 Allergy status to other drugs, medicaments and biological substances status: Secondary | ICD-10-CM

## 2016-10-31 DIAGNOSIS — I519 Heart disease, unspecified: Secondary | ICD-10-CM | POA: Diagnosis present

## 2016-10-31 DIAGNOSIS — I10 Essential (primary) hypertension: Secondary | ICD-10-CM | POA: Diagnosis present

## 2016-10-31 DIAGNOSIS — K8500 Idiopathic acute pancreatitis without necrosis or infection: Secondary | ICD-10-CM | POA: Diagnosis not present

## 2016-10-31 HISTORY — DX: Essential (primary) hypertension: I10

## 2016-10-31 HISTORY — DX: Type 2 diabetes mellitus without complications: E11.9

## 2016-10-31 LAB — CBC
HEMATOCRIT: 42.1 % (ref 39.0–52.0)
HEMOGLOBIN: 14.5 g/dL (ref 13.0–17.0)
MCH: 30.4 pg (ref 26.0–34.0)
MCHC: 34.4 g/dL (ref 30.0–36.0)
MCV: 88.3 fL (ref 78.0–100.0)
Platelets: 307 10*3/uL (ref 150–400)
RBC: 4.77 MIL/uL (ref 4.22–5.81)
RDW: 13.2 % (ref 11.5–15.5)
WBC: 28 10*3/uL — ABNORMAL HIGH (ref 4.0–10.5)

## 2016-10-31 LAB — LIPID PANEL
Cholesterol: 148 mg/dL (ref 0–200)
HDL: 35 mg/dL — AB (ref >40)
LDL CALC: 100 mg/dL — AB (ref 0–99)
TRIGLYCERIDES: 65 mg/dL (ref <150)
Total CHOL/HDL Ratio: 4.2 RATIO
VLDL: 13 mg/dL (ref 0–40)

## 2016-10-31 LAB — URINE MICROSCOPIC-ADD ON: BACTERIA UA: NONE SEEN

## 2016-10-31 LAB — BASIC METABOLIC PANEL
ANION GAP: 12 (ref 5–15)
BUN: 22 mg/dL — AB (ref 6–20)
CHLORIDE: 112 mmol/L — AB (ref 101–111)
CO2: 14 mmol/L — ABNORMAL LOW (ref 22–32)
Calcium: 6.8 mg/dL — ABNORMAL LOW (ref 8.9–10.3)
Creatinine, Ser: 2.13 mg/dL — ABNORMAL HIGH (ref 0.61–1.24)
GFR calc Af Amer: 35 mL/min — ABNORMAL LOW (ref >60)
GFR, EST NON AFRICAN AMERICAN: 30 mL/min — AB (ref >60)
Glucose, Bld: 312 mg/dL — ABNORMAL HIGH (ref 65–99)
POTASSIUM: 7.3 mmol/L — AB (ref 3.5–5.1)
SODIUM: 138 mmol/L (ref 135–145)

## 2016-10-31 LAB — BLOOD GAS, ARTERIAL
ACID-BASE DEFICIT: 13.6 mmol/L — AB (ref 0.0–2.0)
Acid-base deficit: 6.6 mmol/L — ABNORMAL HIGH (ref 0.0–2.0)
Bicarbonate: 18.1 mmol/L — ABNORMAL LOW (ref 20.0–28.0)
Bicarbonate: 11.9 mmol/L — ABNORMAL LOW (ref 20.0–28.0)
Drawn by: 44898
Drawn by: 36277
FIO2: 0.21
O2 SAT: 96.2 %
O2 Saturation: 95.8 %
PATIENT TEMPERATURE: 98.6
PCO2 ART: 34.7 mmHg (ref 32.0–48.0)
PCO2 ART: 26.3 mmHg — AB (ref 32.0–48.0)
PH ART: 7.338 — AB (ref 7.350–7.450)
PH ART: 7.277 — AB (ref 7.350–7.450)
PO2 ART: 90.6 mmHg (ref 83.0–108.0)
PO2 ART: 99.9 mmHg (ref 83.0–108.0)
Patient temperature: 98.6

## 2016-10-31 LAB — POCT I-STAT 3, ART BLOOD GAS (G3+)
Acid-base deficit: 15 mmol/L — ABNORMAL HIGH (ref 0.0–2.0)
Bicarbonate: 11.6 mmol/L — ABNORMAL LOW (ref 20.0–28.0)
O2 SAT: 100 %
PCO2 ART: 29 mmHg — AB (ref 32.0–48.0)
PH ART: 7.21 — AB (ref 7.350–7.450)
Patient temperature: 98.6
TCO2: 12 mmol/L (ref 0–100)
pO2, Arterial: 287 mmHg — ABNORMAL HIGH (ref 83.0–108.0)

## 2016-10-31 LAB — HEPATIC FUNCTION PANEL
ALK PHOS: 38 U/L (ref 38–126)
ALT: 158 U/L — AB (ref 17–63)
AST: 504 U/L — AB (ref 15–41)
Albumin: 2.8 g/dL — ABNORMAL LOW (ref 3.5–5.0)
BILIRUBIN DIRECT: 0.6 mg/dL — AB (ref 0.1–0.5)
BILIRUBIN INDIRECT: 0.4 mg/dL (ref 0.3–0.9)
Total Bilirubin: 1 mg/dL (ref 0.3–1.2)
Total Protein: 5.1 g/dL — ABNORMAL LOW (ref 6.5–8.1)

## 2016-10-31 LAB — COMPREHENSIVE METABOLIC PANEL
ALBUMIN: 4 g/dL (ref 3.5–5.0)
ALT: 68 U/L — ABNORMAL HIGH (ref 17–63)
ANION GAP: 10 (ref 5–15)
AST: 264 U/L — ABNORMAL HIGH (ref 15–41)
Alkaline Phosphatase: 49 U/L (ref 38–126)
BUN: 14 mg/dL (ref 6–20)
CHLORIDE: 107 mmol/L (ref 101–111)
CO2: 25 mmol/L (ref 22–32)
Calcium: 9.8 mg/dL (ref 8.9–10.3)
Creatinine, Ser: 1.48 mg/dL — ABNORMAL HIGH (ref 0.61–1.24)
GFR calc Af Amer: 54 mL/min — ABNORMAL LOW (ref >60)
GFR calc non Af Amer: 47 mL/min — ABNORMAL LOW (ref >60)
GLUCOSE: 225 mg/dL — AB (ref 65–99)
POTASSIUM: 3.3 mmol/L — AB (ref 3.5–5.1)
SODIUM: 142 mmol/L (ref 135–145)
Total Bilirubin: 0.8 mg/dL (ref 0.3–1.2)
Total Protein: 7.4 g/dL (ref 6.5–8.1)

## 2016-10-31 LAB — TROPONIN I
TROPONIN I: 0.06 ng/mL — AB (ref <0.03)
Troponin I: <0.03 ng/mL (ref <0.03)
Troponin I: <0.03 ng/mL (ref <0.03)

## 2016-10-31 LAB — MAGNESIUM
Magnesium: 1.5 mg/dL — ABNORMAL LOW (ref 1.7–2.4)
Magnesium: 1.5 mg/dL — ABNORMAL LOW (ref 1.7–2.4)

## 2016-10-31 LAB — PROCALCITONIN: PROCALCITONIN: 1.57 ng/mL

## 2016-10-31 LAB — PROTIME-INR
INR: 1.16
PROTHROMBIN TIME: 14.9 s (ref 11.4–15.2)

## 2016-10-31 LAB — URINALYSIS, ROUTINE W REFLEX MICROSCOPIC
Glucose, UA: NEGATIVE mg/dL
HGB URINE DIPSTICK: NEGATIVE
Ketones, ur: NEGATIVE mg/dL
Leukocytes, UA: NEGATIVE
Nitrite: NEGATIVE
PH: 5 (ref 5.0–8.0)
Protein, ur: 30 mg/dL — AB
SPECIFIC GRAVITY, URINE: 1.025 (ref 1.005–1.030)

## 2016-10-31 LAB — PHOSPHORUS
PHOSPHORUS: 3.8 mg/dL (ref 2.5–4.6)
Phosphorus: 4.4 mg/dL (ref 2.5–4.6)

## 2016-10-31 LAB — LACTATE DEHYDROGENASE: LDH: 759 U/L — ABNORMAL HIGH (ref 98–192)

## 2016-10-31 LAB — LACTIC ACID, PLASMA
LACTIC ACID, VENOUS: 3.7 mmol/L — AB (ref 0.5–1.9)
LACTIC ACID, VENOUS: 6.5 mmol/L — AB (ref 0.5–1.9)
Lactic Acid, Venous: 4.8 mmol/L (ref 0.5–1.9)

## 2016-10-31 LAB — MRSA PCR SCREENING: MRSA BY PCR: NEGATIVE

## 2016-10-31 LAB — LIPASE, BLOOD

## 2016-10-31 LAB — APTT: aPTT: 26 seconds (ref 24–36)

## 2016-10-31 LAB — TSH: TSH: 0.073 u[IU]/mL — ABNORMAL LOW (ref 0.350–4.500)

## 2016-10-31 LAB — GLUCOSE, CAPILLARY: GLUCOSE-CAPILLARY: 268 mg/dL — AB (ref 65–99)

## 2016-10-31 MED ORDER — INSULIN ASPART 100 UNIT/ML ~~LOC~~ SOLN
2.0000 [IU] | SUBCUTANEOUS | Status: DC
  Administered 2016-11-01: 8 [IU] via SUBCUTANEOUS
  Administered 2016-11-01: 6 [IU] via SUBCUTANEOUS

## 2016-10-31 MED ORDER — SODIUM CHLORIDE 0.9% FLUSH: 9.0000 mL | INTRAVENOUS | Status: DC | PRN

## 2016-10-31 MED ORDER — SODIUM CHLORIDE 0.9 % IV BOLUS (SEPSIS)
1000.0000 mL | Freq: Once | INTRAVENOUS | Status: AC
  Administered 2016-10-31: 1000 mL via INTRAVENOUS

## 2016-10-31 MED ORDER — SODIUM BICARBONATE 8.4 % IV SOLN
INTRAVENOUS | Status: DC
  Administered 2016-10-31 – 2016-11-01 (×3): via INTRAVENOUS
  Filled 2016-10-31 (×7): qty 150

## 2016-10-31 MED ORDER — SODIUM CHLORIDE 0.9 % IV BOLUS (SEPSIS)
500.0000 mL | Freq: Once | INTRAVENOUS | Status: AC
  Administered 2016-10-31: 500 mL via INTRAVENOUS

## 2016-10-31 MED ORDER — MORPHINE SULFATE (PF) 4 MG/ML IV SOLN
4.0000 mg | Freq: Once | INTRAVENOUS | Status: AC
  Administered 2016-10-31: 4 mg via INTRAVENOUS
  Filled 2016-10-31: qty 1

## 2016-10-31 MED ORDER — SODIUM CHLORIDE 0.9% FLUSH: 10.0000 mL | INTRAVENOUS | Status: DC | PRN

## 2016-10-31 MED ORDER — MIDAZOLAM HCL 2 MG/2ML IJ SOLN
INTRAMUSCULAR | Status: AC
  Administered 2016-10-31: 2 mg
  Filled 2016-10-31: qty 2

## 2016-10-31 MED ORDER — ONDANSETRON HCL 4 MG/2ML IJ SOLN
4.0000 mg | Freq: Four times a day (QID) | INTRAMUSCULAR | Status: DC
  Administered 2016-10-31 – 2016-11-01 (×3): 4 mg via INTRAVENOUS
  Filled 2016-10-31 (×3): qty 2

## 2016-10-31 MED ORDER — FENTANYL CITRATE (PF) 100 MCG/2ML IJ SOLN
50.0000 ug | Freq: Once | INTRAMUSCULAR | Status: AC
  Administered 2016-10-31: 50 ug via INTRAVENOUS
  Filled 2016-10-31: qty 2

## 2016-10-31 MED ORDER — SODIUM BICARBONATE 8.4 % IV SOLN
INTRAVENOUS | Status: AC
  Administered 2016-10-31: 50 meq
  Filled 2016-10-31: qty 100

## 2016-10-31 MED ORDER — SODIUM CHLORIDE 0.9 % IV SOLN
500.0000 mg | Freq: Three times a day (TID) | INTRAVENOUS | Status: DC
  Administered 2016-11-01: 500 mg via INTRAVENOUS
  Filled 2016-10-31 (×2): qty 500

## 2016-10-31 MED ORDER — NALOXONE HCL 0.4 MG/ML IJ SOLN: 0.4000 mg | INTRAMUSCULAR | Status: DC | PRN

## 2016-10-31 MED ORDER — PROMETHAZINE HCL 25 MG/ML IJ SOLN
12.5000 mg | Freq: Once | INTRAMUSCULAR | Status: AC
  Administered 2016-10-31: 12.5 mg via INTRAVENOUS
  Filled 2016-10-31: qty 1

## 2016-10-31 MED ORDER — SODIUM CHLORIDE 0.9 % IV BOLUS (SEPSIS): 1000.0000 mL | Freq: Once | INTRAVENOUS | Status: DC

## 2016-10-31 MED ORDER — MAGNESIUM SULFATE 2 GM/50ML IV SOLN
2.0000 g | Freq: Once | INTRAVENOUS | Status: AC
  Administered 2016-10-31: 2 g via INTRAVENOUS
  Filled 2016-10-31: qty 50

## 2016-10-31 MED ORDER — DIPHENHYDRAMINE HCL 12.5 MG/5ML PO ELIX
12.5000 mg | ORAL_SOLUTION | Freq: Four times a day (QID) | ORAL | Status: DC | PRN
  Filled 2016-10-31: qty 5

## 2016-10-31 MED ORDER — PIPERACILLIN-TAZOBACTAM 3.375 G IVPB
3.3750 g | Freq: Three times a day (TID) | INTRAVENOUS | Status: DC
  Administered 2016-10-31: 3.375 g via INTRAVENOUS
  Filled 2016-10-31 (×3): qty 50

## 2016-10-31 MED ORDER — SODIUM CHLORIDE 0.9 % IV SOLN: INTRAVENOUS | Status: DC

## 2016-10-31 MED ORDER — ONDANSETRON HCL 4 MG/2ML IJ SOLN
4.0000 mg | Freq: Once | INTRAMUSCULAR | Status: AC
  Administered 2016-10-31: 4 mg via INTRAVENOUS
  Filled 2016-10-31: qty 2

## 2016-10-31 MED ORDER — ONDANSETRON HCL 4 MG/2ML IJ SOLN: 4.0000 mg | Freq: Four times a day (QID) | INTRAMUSCULAR | Status: DC | PRN

## 2016-10-31 MED ORDER — IOPAMIDOL (ISOVUE-300) INJECTION 61%
INTRAVENOUS | Status: AC
Start: 2016-10-31 — End: 2016-10-31
  Administered 2016-10-31: 100 mL
  Filled 2016-10-31: qty 100

## 2016-10-31 MED ORDER — ACETAMINOPHEN 650 MG RE SUPP: 650.0000 mg | Freq: Four times a day (QID) | RECTAL | Status: DC | PRN

## 2016-10-31 MED ORDER — FENTANYL CITRATE (PF) 100 MCG/2ML IJ SOLN
INTRAMUSCULAR | Status: AC
  Administered 2016-10-31: 100 ug
  Filled 2016-10-31: qty 2

## 2016-10-31 MED ORDER — NOREPINEPHRINE BITARTRATE 1 MG/ML IV SOLN
2.0000 ug/min | INTRAVENOUS | Status: DC
  Administered 2016-10-31: 2 ug/min via INTRAVENOUS
  Filled 2016-10-31 (×2): qty 4

## 2016-10-31 MED ORDER — PROMETHAZINE HCL 25 MG/ML IJ SOLN
12.5000 mg | Freq: Four times a day (QID) | INTRAMUSCULAR | Status: DC | PRN
  Administered 2016-10-31: 12.5 mg via INTRAVENOUS
  Filled 2016-10-31: qty 1

## 2016-10-31 MED ORDER — DIPHENHYDRAMINE HCL 50 MG/ML IJ SOLN: 12.5000 mg | Freq: Four times a day (QID) | INTRAMUSCULAR | Status: DC | PRN

## 2016-10-31 MED ORDER — POTASSIUM CHLORIDE IN NACL 20-0.9 MEQ/L-% IV SOLN
INTRAVENOUS | Status: DC
  Administered 2016-10-31: 15:00:00 via INTRAVENOUS
  Filled 2016-10-31 (×2): qty 1000

## 2016-10-31 MED ORDER — FENTANYL 2500MCG IN NS 250ML (10MCG/ML) PREMIX INFUSION
25.0000 ug/h | INTRAVENOUS | Status: DC
  Administered 2016-11-01: 50 ug/h via INTRAVENOUS
  Filled 2016-10-31: qty 250

## 2016-10-31 MED ORDER — MORPHINE SULFATE 2 MG/ML IV SOLN
INTRAVENOUS | Status: DC
  Administered 2016-10-31: 6 mg via INTRAVENOUS
  Administered 2016-10-31: 3 mg via INTRAVENOUS
  Filled 2016-10-31: qty 25

## 2016-10-31 MED ORDER — PROMETHAZINE HCL 25 MG/ML IJ SOLN: 25.0000 mg | Freq: Four times a day (QID) | INTRAMUSCULAR | Status: DC | PRN

## 2016-10-31 MED ORDER — LEVOTHYROXINE SODIUM 100 MCG IV SOLR
62.5000 ug | Freq: Every day | INTRAVENOUS | Status: DC
  Administered 2016-10-31 – 2016-11-01 (×2): 62.5 ug via INTRAVENOUS
  Filled 2016-10-31 (×2): qty 5

## 2016-10-31 MED ORDER — MIDAZOLAM HCL 2 MG/2ML IJ SOLN: 1.0000 mg | INTRAMUSCULAR | Status: DC | PRN

## 2016-10-31 MED ORDER — ETOMIDATE 2 MG/ML IV SOLN
0.3000 mg/kg | Freq: Once | INTRAVENOUS | Status: AC
  Administered 2016-10-31: 10 mg via INTRAVENOUS

## 2016-10-31 MED ORDER — LEVOTHYROXINE SODIUM 25 MCG PO TABS: 125.0000 ug | ORAL_TABLET | Freq: Every day | ORAL | Status: DC

## 2016-10-31 MED ORDER — ROCURONIUM BROMIDE 50 MG/5ML IV SOLN
1.0000 mg/kg | Freq: Once | INTRAVENOUS | Status: AC
  Administered 2016-10-31: 25 mg via INTRAVENOUS

## 2016-10-31 MED ORDER — ACETAMINOPHEN 325 MG PO TABS: 650.0000 mg | ORAL_TABLET | Freq: Four times a day (QID) | ORAL | Status: DC | PRN

## 2016-10-31 MED ORDER — SODIUM CHLORIDE 0.9% FLUSH
3.0000 mL | Freq: Two times a day (BID) | INTRAVENOUS | Status: DC
  Administered 2016-10-31 (×2): 3 mL via INTRAVENOUS

## 2016-10-31 MED ORDER — SODIUM CHLORIDE 0.9% FLUSH
10.0000 mL | Freq: Two times a day (BID) | INTRAVENOUS | Status: DC
Start: 2016-10-31 — End: 2016-11-01
  Administered 2016-10-31: 40 mL
  Administered 2016-11-01: 10 mL

## 2016-10-31 MED ORDER — INSULIN ASPART 100 UNIT/ML ~~LOC~~ SOLN
0.0000 [IU] | SUBCUTANEOUS | Status: DC
  Administered 2016-10-31: 8 [IU] via SUBCUTANEOUS

## 2016-10-31 NOTE — ED Triage Notes (Signed)
Patient complains of abdominal pain and cramping since am with vomiting and diarrhea. States went to ball game yesterday and had fried fish and chili and thinks related. Slightly diaphoretic on arrival

## 2016-10-31 NOTE — Progress Notes (Signed)
MD made aware, patient had episode of clear emesis.  Heartrate sustaining 130s, RR 30s. Magnesium 1.5, MD to see patient and advise.

## 2016-10-31 NOTE — Progress Notes (Signed)
RN called to report troponin rise to 0.06. Message relayed to [facility] MD.

## 2016-10-31 NOTE — H&P (Signed)
History and Physical    YSABEL COWGILL QDI:[id number] DOB: 1946-04-07 DOA: 10/24/2016   PCP: Criselda Peaches, MD Arletta Bale [facility]  Patient coming from/Resides with: Private residence/lives with wife  Admission status: Inpatient/stepdown -medically necessary to stay a minimum 2 midnights to rule out impending and/or unexpected changes in physiologic status that may differ from initial evaluation performed in the ER and/or at time of admission. Presents with severe pancreatitis and necrosis not ruled out based on CT. Patient will require aggressive IV fluid hydration, frequent monitoring of hemodynamics and respiratory status, NPO status including symptom management of nausea vomiting and diarrhea, frequent lab draws. Patient meets sepsis physiology criteria.  Chief Complaint: Abdominal pain with nausea and vomiting  HPI: GALEN RUSSMAN is a 69 y.o. 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 Patient presented to the ER after waking up this morning and developing abrupt mid abdominal pain very severe. Has been unable to eat. Has had multiple episodes of nausea vomiting as well as nonbloody diarrhea. He has not started any medications. He does not drink alcoholic beverages.  ED Course:  Vital Signs: BP 153/98   Pulse (!) 53   Temp 97.8 F (36.6 C) (Oral)   Resp 17   Wt 85.3 kg (188 lb)   SpO2 100%  CT abdomen and pelvis with contrast: Diffusely edematous pancreas was substantial. Pancreatic edema and inflammation. Associated poor enhancement of the pancreatic parenchyma throughout and pancreatic necrosis cannot be excluded. No pseudocyst or abscess. No cholelithiasis. Lab  data: Sodium 142, potassium 3.3, CO2 25, BUN 14, creatinine 1.48, glucose 225, anion gap 10, alkaline phosphatase 49, lipase greater than 10,000, AST 264, ALT 68, total bilirubin 0.8, troponin less than 0.03, white count 28,000 differential not obtained, hemoglobin 14.5, platelets 307,000 , urinalysis unremarkable except for cloudy appearance small amount of bilirubin amber color 30 of protein no ketones  Medications and treatments: Zofran 4 mg IV 1, normal saline bolus 1500 mL, fentanyl 50 g IV 1, morphine 4 mg IV 3, Phenergan 12.5 mg IV 1  Review of Systems:  In addition to the HPI above,  No Fever-chills, myalgias or other constitutional symptoms No Headache, changes with Vision or hearing, new weakness, tingling, numbness in any extremity, dizziness, dysarthria or word finding difficulty, gait disturbance or imbalance, tremors or seizure activity No problems swallowing food or Liquids, indigestion/reflux, choking or coughing while eating No Chest pain, Cough or Shortness of Breath, palpitations, orthopnea or DOE No melena,hematochezia, dark tarry stools, constipation No dysuria, malodorous urine, hematuria or flank pain No new skin rashes, lesions, masses or bruises, No new joint pains, aches, swelling or redness No recent unintentional weight gain or loss No polyuria, polydypsia or polyphagia   Past Medical History:  Diagnosis Date  . Diabetes mellitus without complication ([place])   . Hypertension     Past Surgical History:  Procedure Laterality Date  . CORONARY ANGIOPLASTY WITH STENT PLACEMENT      Social History   Social History  . Marital status: Married    Spouse name: N/A  . Number of children: N/A  . Years of education: N/A   Occupational History  . Not on file.   Social History Main Topics  .  Smoking status: Never Smoker  . Smokeless tobacco: Never Used  . Alcohol use Not on file  . Drug use: Unknown  . Sexual activity: Not on file   Other Topics Concern    . Not on file   Social History Narrative  . No narrative on file    Mobility: Without assistive devices, typically swims every [place] daily Work history: Retired   Allergies  Allergen Reactions  . Niacin And Related Rash    Makes skin peel and discoloration     Family history reviewed and not pertinent to current admission diagnosis or findings  Prior to Admission medications   Medication Sig Start Date End Date Taking? Authorizing Provider  atorvastatin (LIPITOR) 10 MG tablet Take 10 mg by mouth daily. 08/07/16  Yes Historical Provider, MD  carvedilol (COREG) 3.125 MG tablet Take 3.125 mg by mouth 2 (two) times daily with a meal.   Yes Historical Provider, MD  cetirizine (ZYRTEC) 10 MG tablet Take 10 mg by mouth daily.   Yes Historical Provider, MD  levothyroxine (SYNTHROID, LEVOTHROID) 125 MCG tablet Take 125 mcg by mouth daily. 10/12/16  Yes Historical Provider, MD  lisinopril (PRINIVIL,ZESTRIL) 5 MG tablet Take 5 mg by mouth daily.   Yes Historical Provider, MD  metFORMIN (GLUCOPHAGE) 850 MG tablet Take 850 mg by mouth 2 (two) times daily with a meal.   Yes Historical Provider, MD    Physical Exam: Vitals:   11/14/2016 1008 11/04/2016 1015 11/17/2016 1145 10/23/2016 1351  BP:  181/72 160/75 153/98  Pulse: (!) 43 (!) 45 (!) 53   Resp: 11  17   Temp:      TempSrc:      SpO2: 99% 99% 100%   Weight:          Constitutional: NAD, uncomfortable due to ongoing abdominal pain Eyes: PERRL, lids and conjunctivae normal ENMT: Mucous membranes are dry. Posterior pharynx clear of any exudate or lesions.Normal dentition.  Neck: normal, supple, no masses, no thyromegaly Respiratory: clear to auscultation bilaterally with decreased inspiratory effort, no wheezing, no crackles. No accessory muscle use. RA Cardiovascular: Regular rate and rhythm, no murmurs / rubs / gallops. No extremity edema. 2+ pedal pulses. No carotid bruits.  Abdomen: Generalized abdominal tenderness more focal in the  epigastric region with associated guarding and mild rebounding, no masses palpated. No hepatosplenomegaly. Bowel sounds positive but are extremely hypoactive.  Musculoskeletal: no clubbing / cyanosis. No joint deformity upper and lower extremities. Good ROM, no contractures. Normal muscle tone.  Skin: no rashes, lesions, ulcers. No induration Neurologic: CN 2-12 grossly intact. Sensation intact, DTR normal. Strength 5/5 x all 4 extremities.  Psychiatric: Normal judgment and insight. Alert and oriented x 3. Normal mood appropriate to current situation.    Labs on Admission: I have personally reviewed following labs and imaging studies  CBC:  Recent Labs Lab 11/09/2016 0948  WBC 28.0*  HGB 14.5  HCT 42.1  MCV 88.3  PLT 045   Basic Metabolic Panel:  Recent Labs Lab 11/09/2016 0948  NA 142  K 3.3*  CL 107  CO2 25  GLUCOSE 225*  BUN 14  CREATININE 1.48*  CALCIUM 9.8   GFR: CrCl cannot be calculated (Unknown ideal weight.). Liver Function Tests:  Recent Labs Lab 11/07/2016 0948  AST 264*  ALT 68*  ALKPHOS 49  BILITOT 0.8  PROT 7.4  ALBUMIN 4.0    Recent Labs Lab 10/21/2016 0948  LIPASE >10,000*   No results for input(s): AMMONIA in the  last 168 hours. Coagulation Profile: No results for input(s): INR, PROTIME in the last 168 hours. Cardiac Enzymes:  Recent Labs Lab 11/02/2016 0948  TROPONINI <0.03   BNP (last 3 results) No results for input(s): PROBNP in the last 8760 hours. HbA1C: No results for input(s): HGBA1C in the last 72 hours. CBG: No results for input(s): GLUCAP in the last 168 hours. Lipid Profile: No results for input(s): CHOL, HDL, LDLCALC, TRIG, CHOLHDL, LDLDIRECT in the last 72 hours. Thyroid Function Tests: No results for input(s): TSH, T4TOTAL, FREET4, T3FREE, THYROIDAB in the last 72 hours. Anemia Panel: No results for input(s): VITAMINB12, FOLATE, FERRITIN, TIBC, IRON, RETICCTPCT in the last 72 hours. Urine analysis:    Component Value  Date/Time   COLORURINE AMBER (A) 10/25/2016 1243   APPEARANCEUR CLOUDY (A) 10/27/2016 1243   LABSPEC 1.025 11/18/2016 1243   PHURINE 5.0 11/09/2016 1243   GLUCOSEU NEGATIVE 11/06/2016 1243   HGBUR NEGATIVE 11/08/2016 1243   BILIRUBINUR SMALL (A) 10/24/2016 1243   KETONESUR NEGATIVE 10/21/2016 1243   PROTEINUR 30 (A) 10/28/2016 1243   UROBILINOGEN 0.2 09/02/2009 0852   NITRITE NEGATIVE 10/27/2016 1243   LEUKOCYTESUR NEGATIVE 11/03/2016 1243   Sepsis Labs: _0 (procalcitonin:4,lacticidven:4) )No results found for this or any previous visit (from the past 240 hour(s)).   Radiological Exams on Admission: Ct Abdomen Pelvis W Contrast  Result Date: 11/10/2016 CLINICAL DATA:  Upper abdominal pain with nausea and vomiting and diarrhea. EXAM: CT ABDOMEN AND PELVIS WITH CONTRAST TECHNIQUE: Multidetector CT imaging of the abdomen and pelvis was performed using the standard protocol following bolus administration of intravenous contrast. CONTRAST:  176m ISOVUE-300 IOPAMIDOL (ISOVUE-300) INJECTION 61% COMPARISON:  None. FINDINGS: Lower chest:  Unremarkable. Hepatobiliary: Scattered tiny hypodensities in the liver are too small to characterize. There is no evidence for gallstones, gallbladder wall thickening, or pericholecystic fluid. No intrahepatic or extrahepatic biliary dilation. Pancreas: Pancreas is diffusely edematous and thickened with substantial peripancreatic edema/ inflammation in the anterior para renal space. For enhancement of the pancreatic parenchyma is noted throughout. Spleen: No splenomegaly. No focal mass lesion. Adrenals/Urinary Tract: No adrenal nodule or mass. Tiny hypodensity interpolar right kidney too small to characterize but likely a cyst. Left kidney unremarkable. No evidence for hydroureter. The urinary bladder appears normal for the degree of distention. Stomach/Bowel: Stomach is nondistended. No gastric wall thickening. No evidence of outlet obstruction. Duodenum is  normally positioned as is the ligament of Treitz. No small bowel wall thickening. No small bowel dilatation. The terminal ileum is normal. The appendix is normal. No gross colonic mass. No colonic wall thickening. No substantial diverticular change. Vascular/Lymphatic: There is abdominal aortic atherosclerosis without aneurysm. There is no gastrohepatic or hepatoduodenal ligament lymphadenopathy. No intraperitoneal or retroperitoneal lymphadenopathy. Small lymph nodes are seen in the hepatoduodenal ligament. No pelvic sidewall lymphadenopathy. Portal vein and superior mesenteric vein are patent. Splenic vein is patent. Reproductive: The prostate gland and seminal vesicles have normal imaging features. Penile prosthesis noted. Other: Small volume fluid seen around the liver and spleen, both para colic gutters, and anatomic pelvis. Musculoskeletal: Bone windows reveal no worrisome lytic or sclerotic osseous lesions. IMPRESSION: 1. Diffusely edematous pancreas with substantial peripancreatic edema/inflammation. There is poor enhancement pancreatic parenchyma throughout and pancreatic necrosis cannot be excluded. No evidence for organized or rim enhancing fluid collection at this time to suggest mature pseudocyst or abscess. No evidence for vascular complication. 2. Small volume intraperitoneal free fluid. 3. No evidence for cholelithiasis. 4. Abdominal aortic atherosclerosis. Electronically Signed   By: ERandall Hiss  Tery Sanfilippo M.D.   On: 11/06/2016 13:02   Dg Chest Port 1 View  Result Date: 10/20/2016 CLINICAL DATA:  Acute pancreatitis.  Shortness of breath. EXAM: PORTABLE CHEST 1 VIEW COMPARISON:  09/02/2009 FINDINGS: 1341 hours. Low lung volumes. Bibasilar atelectasis. No edema or focal airspace consolidation. No substantial pleural effusion. Cardiopericardial silhouette is at upper limits of normal for size. The visualized bony structures of the thorax are intact. Telemetry leads overlie the chest. IMPRESSION: Low volume  film with basilar atelectasis. Electronically Signed   By: Misty Stanley M.D.   On: 11/07/2016 13:56    EKG: (Independently reviewed) sinus bradycardia with ventricular rate 44 bpm, QTC 444 ms, prominent T wave in V2 with inverted downslope T waves in inferior lateral leads consistent with LVH and LV strain-EKG from 2010 read demonstrates prominent T wave in V2 with inverted T waves in leads 3 and aVF but not in lateral leads  Assessment/Plan Principal Problem:   Acute pancreatitis -Patient presents with abrupt onset of abdominal pain with clinical findings consistent with severe pancreatitis -Ranson's criteria 4 with LDH pending **LDH 759 which makes Ranson criteria 5 -lipase greater than 10,000, repeat in a.m. -Etiology unclear: no alcohol - GI recommends abd [place] to r/o biliary etiology -Lipid panel to rule out hypertriglyceridemia **TG 65 -Hold ACE inhibitor in the event this is precipitated; patient reports has been on for at least 8 years -GI consultation -NPO -Supportive care with aggressive IV fluid hydration (150/hr), PCA morphine, prn Phenergan and scheduled Zofran; insert PICC line **Given lactate >4 will proceed with additional fluid boluses: rec weight based volume is 3000 ml and was given 1500 in ER so will give 1500 ml more -Repeat labs in a.m. -No evidence of abscess or pseudocyst but significant parenchymal edema concerning for necrosis therefore at high risk to evolve into hemorrhagic pancreatitis so we'll avoid pharmacological anticoagulation  Active Problems:   Sepsis  -Source is pancreas-GI suspects biliary etiology so Zosyn until clarified-pharmacy to dose -Sepsis physiology met as follows: Tachypnea, white count greater than 12,000, acute kidney injury with associated transaminitis consistent with end organ involvement -Is hemodynamically stable at this juncture and is mentating appropriately -Cycle lactic acid ** Initial is 4.8 (resulted after admission) and  Procalcitonin ** 1.57 -see above re: volume resuscitation **consider PCCM evaluation -Check blood cultures, coags -Continue supportive care as outlined above/treat underlying causes    Acute kidney injury on CKD (chronic kidney disease) stage 3, GFR 30-59 ml/min -Baseline renal function in 2010:15/1.35 -Current renal function: 14/1.48 and systemic with evolving acute kidney injury -Metformin and ACE inhibitor on hold -Follow chemistries -Creatinine is less than 2 and GFR is greater than 43 to appropriate to consider PICC line placement      Acute hypokalemia -IV replacement and follow chemistries    Transaminitis -Related to peripancreatic inflammation -Follow chemistries -No evidence of gallstones on CT    Diabetes mellitus type 2, uncontrolled  -Suspect related to acute severe pancreatitis -Hold metformin -Follow CBGs every 4 hours and provide moderate SSI -Hemoglobin A1c    Acute on chronic systolic heart failure, NYHA class 1/Left ventricular diastolic dysfunction, NYHA class 1 -Appears compensated -Checked portable chest x-ray since admission - currently not in heart failure -Monitor closely given need for aggressive volume resuscitation in setting of acute pancreatitis and sepsis physiology -ACE inhibitor and beta blocker on hold -Last echocardiogram April 2016: EF 40-45% with grade 1 diastolic dysfunction and mild mitral regurgitation    CAD (coronary artery disease) prior stent  OM 2004 -Currently asymptomatic without chest pain and initial troponin normal -Patient does have new T-wave changes in inferior lateral leads that possibly are more reflective of evolution of LVH/left ventricular diastolic dysfunction but given acuteness of illness will proceed with echocardiogram this admission -Cycle troponin -Having bradycardia down into the 40s which is hemodynamically stable but since acutely ill will hold carvedilol for now and if needed can give IV Lopressor for  tachycardia -Holding statin given nothing by mouth status and transaminitis    HTN (hypertension) -Current blood pressure controlled -Holding ACE inhibitor and beta blocker as above    HLD (hyperlipidemia) -Holding statin -Follow up on lipid panel regarding triglycerides   Hypothyroidism -Continue Synthroid -Check TSH      DVT prophylaxis:  SCDs  Code Status: full   Family Communication:  wife at bedside  Disposition Plan: anticipate discharge back to preadmission home environment once medically stable  Consults called: Gastroenterology Oletta Lamas    Samella Parr ANP-BC [facility] Pager [phone]   If 7PM-7AM, please contact night-coverage [email or web address] Password [email or web address]  11/16/2016, 2:00 PM

## 2016-10-31 NOTE — Progress Notes (Addendum)
Pt is unstable, BP continues to drop after boluses, fifth bolus hanging now. BP 93/75. RR 40s-50s, HR 110s. Rapid response RN at bedside. Dr. Aggie Moats and Schorr, NP notified.

## 2016-10-31 NOTE — Procedures (Signed)
Intubation Procedure Note KERION WIEBELHAUS [id number] 1946/05/09  Procedure: Intubation Indications: Respiratory insufficiency  Procedure Details Consent: Unable to obtain consent because of emergent medical necessity. Time Out: Verified patient identification, verified procedure, site/side was marked, verified correct patient position, special equipment/implants available, medications/allergies/relevent history reviewed, required imaging and test results available.  Performed  Maximum sterile technique was used including gloves, hand hygiene and mask.  MAC    Evaluation Hemodynamic Status: BP stable throughout; O2 sats: stable throughout Patient's Current Condition: stable Complications: No apparent complications Patient did tolerate procedure well. Chest X-ray ordered to verify placement.  CXR: pending.   Sebastin Perlmutter 10/20/2016

## 2016-10-31 NOTE — Progress Notes (Signed)
MD made aware via textpage Lactic Acid 3.7, patient currently receiving Zosyn and fluid boluses.

## 2016-10-31 NOTE — Progress Notes (Signed)
Peripherally Inserted Central Catheter/Midline Placement  The IV Nurse has discussed with the patient and/or persons authorized to consent for the patient, the purpose of this procedure and the potential benefits and risks involved with this procedure.  The benefits include less needle sticks, lab draws from the catheter, and the patient may be discharged home with the catheter. Risks include, but not limited to, infection, bleeding, blood clot (thrombus formation), and puncture of an artery; nerve damage and irregular heartbeat and possibility to perform a PICC exchange if needed/ordered by physician.  Alternatives to this procedure were also discussed.  Bard Power PICC patient education guide, fact sheet on infection prevention and patient information card has been provided to patient /or left at bedside.   Wife signed consent per pt request due to drowsiness and discomfort. Pt gave verbal and implied agreement. PICC/Midline Placement Documentation  PICC Double Lumen 11/07/2016 PICC Right Brachial 44 cm 2 cm (Active)  Indication for Insertion or Continuance of Line Prolonged intravenous therapies 11/16/2016  4:03 PM  Exposed Catheter (cm) 2 cm 10/30/2016  4:03 PM  Site Assessment Clean;Dry;Intact 11/16/2016  4:03 PM  Lumen #1 Status Flushed;Saline locked;Blood return noted 11/05/2016  4:03 PM  Lumen #2 Status Flushed;Saline locked;Blood return noted 10/25/2016  4:03 PM  Dressing Type Transparent 10/25/2016  4:03 PM  Dressing Status Clean;Dry;Intact;Antimicrobial disc in place 11/03/2016  4:03 PM  Line Care Connections checked and tightened 10/21/2016  4:03 PM  Line Adjustment (NICU/IV Team Only) No 10/26/2016  4:03 PM  Dressing Intervention New dressing 11/09/2016  4:03 PM  Dressing Change Due 11/07/16 10/24/2016  4:03 PM       Rolena Infante 10/31/2016, 4:04 PM

## 2016-10-31 NOTE — ED Notes (Signed)
Delay in lab draw, exray in room 

## 2016-10-31 NOTE — Progress Notes (Signed)
Pharmacy Antibiotic Note  Albert Keller is a 70 y.o. male admitted on 10/22/2016 with abdominal pain/necrotizing pancreatitis.  Pharmacy has been consulted for Primaxin dosing.  Plan: Primaxin 500 mg IV q8h F/U renal function  Height: 5\' 11"  (180.3 cm) Weight: 191 lb 5.8 oz (86.8 kg) IBW/kg (Calculated) : 75.3  Temp (24hrs), Avg:97.9 F (36.6 C), Min:97.8 F (36.6 C), Max:98.1 F (36.7 C)   Recent Labs Lab 11/11/2016 0948 11/14/2016 1400 11/02/2016 1612 10/23/2016 1950 11/18/2016 2000  WBC 28.0*  --   --   --   --   CREATININE 1.48*  --   --  2.13*  --   LATICACIDVEN  --  4.8* 3.7*  --  6.5*    Estimated Creatinine Clearance: 34.9 mL/min (by C-G formula based on SCr of 2.13 mg/dL (H)).    Allergies  Allergen Reactions  . Niacin And Related Rash    Makes skin peel and discoloration    Caryl Pina 11/14/2016 10:53 PM

## 2016-10-31 NOTE — ED Provider Notes (Signed)
[facility] DEPT Provider Note   CSN: [id number] Arrival date & time: 11/17/2016  0911     History   Chief Complaint Chief Complaint  Patient presents with  . Abdominal Pain  . Emesis  . Diarrhea    HPI Albert Keller is a 70 y.o. male.  HPI Patient presents with upper abdominal pain nausea vomiting diarrhea. Began this morning. States he thinks is from something he ate yesterday. Pain is dull. It is crampy. Does not feel better after sono. He's been just throwing up stomach contents. States he feels bad all over also. No one else that ate with him as having similar symptoms.   Past Medical History:  Diagnosis Date  . Diabetes mellitus without complication ([place])   . Hypertension     Patient Active Problem List   Diagnosis Date Noted  . Acute pancreatitis 10/26/2016  . Sepsis ([place]) 11/04/2016  . Acute kidney injury ([place]) 11/18/2016  . CKD (chronic kidney disease) stage 3, GFR 30-59 ml/min 10/28/2016  . Acute hypokalemia 11/09/2016  . Transaminitis 11/11/2016  . Diabetes mellitus type 2, uncontrolled ([place]) 10/22/2016  . Acute on chronic systolic heart failure, NYHA class 1 ([place]) 11/17/2016  . Left ventricular diastolic dysfunction, NYHA class 1 11/05/2016  . CAD (coronary artery disease) prior stent OM 2004 10/30/2016  . HTN (hypertension) 11/11/2016  . HLD (hyperlipidemia) 11/11/2016    Past Surgical History:  Procedure Laterality Date  . CORONARY ANGIOPLASTY WITH STENT PLACEMENT         Home Medications    Prior to Admission medications   Medication Sig Start Date End Date Taking? Authorizing Provider  atorvastatin (LIPITOR) 10 MG tablet Take 10 mg by mouth daily. 08/07/16  Yes Historical Provider, MD  carvedilol (COREG) 3.125 MG tablet Take 3.125 mg by mouth 2 (two) times daily with a meal.   Yes Historical Provider, MD  cetirizine (ZYRTEC) 10 MG tablet Take 10 mg by mouth daily.   Yes Historical Provider, MD  levothyroxine (SYNTHROID, LEVOTHROID) 125  MCG tablet Take 125 mcg by mouth daily. 10/12/16  Yes Historical Provider, MD  lisinopril (PRINIVIL,ZESTRIL) 5 MG tablet Take 5 mg by mouth daily.   Yes Historical Provider, MD  metFORMIN (GLUCOPHAGE) 850 MG tablet Take 850 mg by mouth 2 (two) times daily with a meal.   Yes Historical Provider, MD    Family History No family history on file.  Social History Social History  Substance Use Topics  . Smoking status: Never Smoker  . Smokeless tobacco: Never Used  . Alcohol use Not on file     Allergies   Niacin and related   Review of Systems Review of Systems  Constitutional: Positive for appetite change.  HENT: Negative for congestion.   Respiratory: Negative for shortness of breath.   Cardiovascular: Negative for chest pain.  Gastrointestinal: Positive for abdominal pain, diarrhea, nausea and vomiting.  Genitourinary: Negative for dysuria.  Musculoskeletal: Negative for back pain.  Neurological: Negative for weakness and headaches.  Psychiatric/Behavioral: Negative for confusion.     Physical Exam Updated Vital Signs BP 166/70 (BP Location: Left Arm)   Pulse 71   Temp 97.8 F (36.6 C) (Oral)   Resp (!) 34   Wt 188 lb (85.3 kg)   SpO2 99%   Physical Exam  Constitutional: He appears well-developed.  Patient appears uncomfortable  HENT:  Head: Normocephalic.  Eyes: EOM are normal.  Neck: Neck supple.  Cardiovascular: Normal rate.   Pulmonary/Chest: No respiratory distress.  Abdominal: Soft.  There is tenderness.  Epigastric tenderness without rebound or guarding. No hernias palpated.  Musculoskeletal: He exhibits no edema.  Neurological: He is alert.  Skin: Skin is warm.     ED Treatments / Results  Labs (all labs ordered are listed, but only abnormal results are displayed) Labs Reviewed  LIPASE, BLOOD - Abnormal; Notable for the following:       Result Value   Lipase >10,000 (*)    All other components within normal limits  COMPREHENSIVE METABOLIC  PANEL - Abnormal; Notable for the following:    Potassium 3.3 (*)    Glucose, Bld 225 (*)    Creatinine, Ser 1.48 (*)    AST 264 (*)    ALT 68 (*)    GFR calc non Af Amer 47 (*)    GFR calc Af Amer 54 (*)    All other components within normal limits  CBC - Abnormal; Notable for the following:    WBC 28.0 (*)    All other components within normal limits  URINALYSIS, ROUTINE W REFLEX MICROSCOPIC (NOT AT [facility]) - Abnormal; Notable for the following:    Color, Urine AMBER (*)    APPearance CLOUDY (*)    Bilirubin Urine SMALL (*)    Protein, ur 30 (*)    All other components within normal limits  LACTATE DEHYDROGENASE - Abnormal; Notable for the following:    LDH 759 (*)    All other components within normal limits  LACTIC ACID, PLASMA - Abnormal; Notable for the following:    Lactic Acid, Venous 4.8 (*)    All other components within normal limits  LIPID PANEL - Abnormal; Notable for the following:    HDL 35 (*)    LDL Cholesterol 100 (*)    All other components within normal limits  URINE MICROSCOPIC-ADD ON - Abnormal; Notable for the following:    Squamous Epithelial / LPF 0-5 (*)    All other components within normal limits  GLUCOSE, CAPILLARY - Abnormal; Notable for the following:    Glucose-Capillary 268 (*)    All other components within normal limits  MRSA PCR SCREENING  CULTURE, BLOOD (ROUTINE X 2)  CULTURE, BLOOD (ROUTINE X 2)  TROPONIN I  PROCALCITONIN  LACTIC ACID, PLASMA  HEMOGLOBIN A1C  TROPONIN I  TROPONIN I  MAGNESIUM  PHOSPHORUS  TSH  PROTIME-INR  APTT  BLOOD GAS, ARTERIAL  TROPONIN I    EKG  EKG Interpretation  Date/Time:  Sunday October 31 2016 10:07:05 EST Ventricular Rate:  44 PR Interval:    QRS Duration: 102 QT Interval:  518 QTC Calculation: 444 R Axis:   -39 Text Interpretation:  Sinus bradycardia Abnormal R-wave progression, early transition Left ventricular hypertrophy Abnormal T, consider ischemia, diffuse leads Confirmed by  Alvino Chapel  MD, Ovid Curd [phone]) on 11/12/2016 10:35:07 AM Also confirmed by Alvino Chapel  MD, Rayonna Heldman [phone]), editor [place]  CCT, [place] ([id number])  on 10/27/2016 10:48:36 AM       Radiology Ct Abdomen Pelvis W Contrast  Result Date: 10/22/2016 CLINICAL DATA:  Upper abdominal pain with nausea and vomiting and diarrhea. EXAM: CT ABDOMEN AND PELVIS WITH CONTRAST TECHNIQUE: Multidetector CT imaging of the abdomen and pelvis was performed using the standard protocol following bolus administration of intravenous contrast. CONTRAST:  192mL ISOVUE-300 IOPAMIDOL (ISOVUE-300) INJECTION 61% COMPARISON:  None. FINDINGS: Lower chest:  Unremarkable. Hepatobiliary: Scattered tiny hypodensities in the liver are too small to characterize. There is no evidence for gallstones, gallbladder wall thickening, or pericholecystic fluid. No  intrahepatic or extrahepatic biliary dilation. Pancreas: Pancreas is diffusely edematous and thickened with substantial peripancreatic edema/ inflammation in the anterior para renal space. For enhancement of the pancreatic parenchyma is noted throughout. Spleen: No splenomegaly. No focal mass lesion. Adrenals/Urinary Tract: No adrenal nodule or mass. Tiny hypodensity interpolar right kidney too small to characterize but likely a cyst. Left kidney unremarkable. No evidence for hydroureter. The urinary bladder appears normal for the degree of distention. Stomach/Bowel: Stomach is nondistended. No gastric wall thickening. No evidence of outlet obstruction. Duodenum is normally positioned as is the ligament of Treitz. No small bowel wall thickening. No small bowel dilatation. The terminal ileum is normal. The appendix is normal. No gross colonic mass. No colonic wall thickening. No substantial diverticular change. Vascular/Lymphatic: There is abdominal aortic atherosclerosis without aneurysm. There is no gastrohepatic or hepatoduodenal ligament lymphadenopathy. No intraperitoneal or retroperitoneal  lymphadenopathy. Small lymph nodes are seen in the hepatoduodenal ligament. No pelvic sidewall lymphadenopathy. Portal vein and superior mesenteric vein are patent. Splenic vein is patent. Reproductive: The prostate gland and seminal vesicles have normal imaging features. Penile prosthesis noted. Other: Small volume fluid seen around the liver and spleen, both para colic gutters, and anatomic pelvis. Musculoskeletal: Bone windows reveal no worrisome lytic or sclerotic osseous lesions. IMPRESSION: 1. Diffusely edematous pancreas with substantial peripancreatic edema/inflammation. There is poor enhancement pancreatic parenchyma throughout and pancreatic necrosis cannot be excluded. No evidence for organized or rim enhancing fluid collection at this time to suggest mature pseudocyst or abscess. No evidence for vascular complication. 2. Small volume intraperitoneal free fluid. 3. No evidence for cholelithiasis. 4. Abdominal aortic atherosclerosis. Electronically Signed   By: Misty Stanley M.D.   On: 10/23/2016 13:02   Dg Chest Port 1 View  Result Date: 10/29/2016 CLINICAL DATA:  Acute pancreatitis.  Shortness of breath. EXAM: PORTABLE CHEST 1 VIEW COMPARISON:  09/02/2009 FINDINGS: 1341 hours. Low lung volumes. Bibasilar atelectasis. No edema or focal airspace consolidation. No substantial pleural effusion. Cardiopericardial silhouette is at upper limits of normal for size. The visualized bony structures of the thorax are intact. Telemetry leads overlie the chest. IMPRESSION: Low volume film with basilar atelectasis. Electronically Signed   By: Misty Stanley M.D.   On: 11/16/2016 13:56    Procedures Procedures (including critical care time)  Medications Ordered in ED Medications  insulin aspart (novoLOG) injection 0-15 Units (8 Units Subcutaneous Given 10/23/2016 1529)  naloxone (NARCAN) injection 0.4 mg (not administered)    And  sodium chloride flush (NS) 0.9 % injection 9 mL (not administered)    diphenhydrAMINE (BENADRYL) injection 12.5 mg (not administered)    Or  diphenhydrAMINE (BENADRYL) 12.5 MG/5ML elixir 12.5 mg (not administered)  morphine (MORPHINE) 2 mg/mL PCA injection ( Intravenous Hold 10/23/2016 1345)  0.9 % NaCl with KCl 20 mEq/ L  infusion ( Intravenous New Bag/Given 10/20/2016 1519)  sodium chloride flush (NS) 0.9 % injection 3 mL (3 mLs Intravenous Given 10/28/2016 1520)  acetaminophen (TYLENOL) tablet 650 mg (not administered)    Or  acetaminophen (TYLENOL) suppository 650 mg (not administered)  ondansetron (ZOFRAN) injection 4 mg (4 mg Intravenous Given 11/18/2016 1515)  promethazine (PHENERGAN) injection 12.5 mg (not administered)  levothyroxine (SYNTHROID, LEVOTHROID) injection 62.5 mcg (62.5 mcg Intravenous Given 11/03/2016 1515)  sodium chloride 0.9 % bolus 500 mL (500 mLs Intravenous Given 10/29/2016 1632)    And  sodium chloride 0.9 % bolus 1,000 mL (not administered)  piperacillin-tazobactam (ZOSYN) IVPB 3.375 g (not administered)  sodium chloride flush (NS) 0.9 %  injection 10-40 mL (not administered)  sodium chloride flush (NS) 0.9 % injection 10-40 mL (not administered)  ondansetron (ZOFRAN) injection 4 mg (4 mg Intravenous Given 11/16/2016 0948)  sodium chloride 0.9 % bolus 500 mL (0 mLs Intravenous Stopped 11/17/2016 1046)  fentaNYL (SUBLIMAZE) injection 50 mcg (50 mcg Intravenous Given 11/04/2016 1002)  morphine 4 MG/ML injection 4 mg (4 mg Intravenous Given 10/27/2016 1046)  morphine 4 MG/ML injection 4 mg (4 mg Intravenous Given 10/24/2016 1204)  promethazine (PHENERGAN) injection 12.5 mg (12.5 mg Intravenous Given 11/09/2016 1204)  iopamidol (ISOVUE-300) 61 % injection (100 mLs  Contrast Given 11/02/2016 1216)  sodium chloride 0.9 % bolus 1,000 mL (1,000 mLs Intravenous New Bag/Given 11/02/2016 1357)  morphine 4 MG/ML injection 4 mg (4 mg Intravenous Given 10/24/2016 1357)  morphine 4 MG/ML injection 4 mg (4 mg Intravenous Given 11/16/2016 1610)     Initial Impression /  Assessment and Plan / ED Course  I have reviewed the triage vital signs and the nursing notes.  Pertinent labs & imaging results that were available during my care of the patient were reviewed by me and considered in my medical decision making (see chart for details).  Clinical Course     Patient with abdominal pain nausea vomiting. Has a rather severe pancreatitis. He does not have gallstones and does not drink alcohol. Will admit to internal medicine for further evaluation. Will admit to stepdown bed.  Final Clinical Impressions(s) / ED Diagnoses   Final diagnoses:  Acute pancreatitis, unspecified complication status, unspecified pancreatitis type    New Prescriptions Current Discharge Medication List       Davonna Belling, MD 10/31/16 1636

## 2016-10-31 NOTE — Progress Notes (Signed)
Spoke with Lissa Merlin, NP regarding patient elevated lactic 4.8, new orders received for ABG, NP indicated patient may transfer to ICU.

## 2016-10-31 NOTE — Progress Notes (Signed)
Pt transferred to 2M04. Report called by this RN to [place], Therapist, sports. Pt transferred by the charge nurse Rojelio Brenner, RN), rapid response nurse Magda Paganini, RN), and day shift nurse Anderson Malta, RN).

## 2016-10-31 NOTE — Progress Notes (Signed)
Pharmacy Antibiotic Note  Albert Keller is a 70 y.o. male admitted on 11/11/2016 with intra-abdominal infection.  Pharmacy has been consulted for zosyn dosing.  Plan: Zosyn 3.375g IV q8h (4 hour infusion).  Weight: 188 lb (85.3 kg)  Temp (24hrs), Avg:97.8 F (36.6 C), Min:97.8 F (36.6 C), Max:97.8 F (36.6 C)   Recent Labs Lab 11/02/2016 0948 11/06/2016 1400  WBC 28.0*  --   CREATININE 1.48*  --   LATICACIDVEN  --  4.8*    CrCl cannot be calculated (Unknown ideal weight.).    Allergies  Allergen Reactions  . Niacin And Related Rash    Makes skin peel and discoloration    Levester Fresh, PharmD, BCPS, [facility] Clinical Pharmacist Pager [phone] 11/08/2016 3:29 PM

## 2016-10-31 NOTE — Procedures (Signed)
OGT Placement By MD  OGT placed under direct laryngoscopy to decompress stomach for pancreatitis.  Rush Farmer, M.D. [facility] Pulmonary/Critical Care Medicine. Pager: [phone]. After hours pager: [phone].

## 2016-10-31 NOTE — Consult Note (Signed)
[facility] Reason for consult:Pancreatitis Referring Physician: [facility] hospitalist. PCP: Dr. Nyoka Cowden. Primary G.I.: none  Albert Keller is an 70 y.o. male.  HPI: he was admitted from the emergency room with acute pancreatitis. This is his 1st episode. He is a nondrinker. He has a history of mild congestive heart failure and his been treated for this and has a cardiac stent. He has mild glucose intolerance and is treated with metformin. He's not had any new medications. He woke up this morning began to have fairly significant bit abdominal pain and nausea is gotten progressively worse. CT scan showed poor enhancement of the pancreatic parenchyma without pseudocyst or abscess. There wasgross sign of cholelithiasis. Labs revealed normal total bilirubin but slight elevation of AST and ALT alkaline phosphatase was normal. Lipase was markedly elevated greater than 10,000. WBC was elevated 28. Patient states that he has no knowledge of gallstones and his family. He has a history of high cholesterol and he thinks he may have had high triglycerides but it's not sure. He denies any change in his medications and denies the use of any OTC herbs or supplements.  Past Medical History:  Diagnosis Date  . Diabetes mellitus without complication ([place])   . Hypertension     Past Surgical History:  Procedure Laterality Date  . CORONARY ANGIOPLASTY WITH STENT PLACEMENT      No family history on file.  Social History:  reports that he has never smoked. He has never used smokeless tobacco. His alcohol and drug histories are not on file.  Allergies:  Allergies  Allergen Reactions  . Niacin And Related Rash    Makes skin peel and discoloration    Medications; Prior to Admission medications   Medication Sig Start Date End Date Taking? Authorizing Provider  atorvastatin (LIPITOR) 10 MG tablet Take 10 mg by mouth daily. 08/07/16  Yes Historical Provider, MD  carvedilol (COREG) 3.125 MG tablet  Take 3.125 mg by mouth 2 (two) times daily with a meal.   Yes Historical Provider, MD  cetirizine (ZYRTEC) 10 MG tablet Take 10 mg by mouth daily.   Yes Historical Provider, MD  levothyroxine (SYNTHROID, LEVOTHROID) 125 MCG tablet Take 125 mcg by mouth daily. 10/12/16  Yes Historical Provider, MD  lisinopril (PRINIVIL,ZESTRIL) 5 MG tablet Take 5 mg by mouth daily.   Yes Historical Provider, MD  metFORMIN (GLUCOPHAGE) 850 MG tablet Take 850 mg by mouth 2 (two) times daily with a meal.   Yes Historical Provider, MD   . insulin aspart  0-15 Units Subcutaneous Q4H  . levothyroxine  125 mcg Oral Daily  . morphine   Intravenous Q4H  . ondansetron (ZOFRAN) IV  4 mg Intravenous Q6H  . sodium chloride  1,000 mL Intravenous Once  . sodium chloride flush  3 mL Intravenous Q12H   PRN Meds acetaminophen **OR** acetaminophen, diphenhydrAMINE **OR** diphenhydrAMINE, naloxone **AND** sodium chloride flush, promethazine Results for orders placed or performed during the hospital encounter of 10/30/2016 (from the past 48 hour(s))  Lipase, blood     Status: Abnormal   Collection Time: 11/08/2016  9:48 AM  Result Value Ref Range   Lipase >10,000 (H) 11 - 51 U/Keller    Comment: RESULTS CONFIRMED BY MANUAL DILUTION  Comprehensive metabolic panel     Status: Abnormal   Collection Time: 11/14/2016  9:48 AM  Result Value Ref Range   Sodium 142 135 - 145 mmol/Keller   Potassium 3.3 (Keller) 3.5 - 5.1 mmol/Keller   Chloride 107 101 -  111 mmol/Keller   CO2 25 22 - 32 mmol/Keller   Glucose, Bld 225 (H) 65 - 99 mg/dL   BUN 14 6 - 20 mg/dL   Creatinine, Ser 1.48 (H) 0.61 - 1.24 mg/dL   Calcium 9.8 8.9 - 10.3 mg/dL   Total Protein 7.4 6.5 - 8.1 g/dL   Albumin 4.0 3.5 - 5.0 g/dL   AST 264 (H) 15 - 41 U/Keller   ALT 68 (H) 17 - 63 U/Keller   Alkaline Phosphatase 49 38 - 126 U/Keller   Total Bilirubin 0.8 0.3 - 1.2 mg/dL   GFR calc non Af Amer 47 (Keller) >60 mL/min   GFR calc Af Amer 54 (Keller) >60 mL/min    Comment: (NOTE) The eGFR has been calculated using the CKD  EPI equation. This calculation has not been validated in all clinical situations. eGFR's persistently <60 mL/min signify possible Chronic Kidney Disease.    Anion gap 10 5 - 15  CBC     Status: Abnormal   Collection Time: 11/15/2016  9:48 AM  Result Value Ref Range   WBC 28.0 (H) 4.0 - 10.5 K/uL   RBC 4.77 4.22 - 5.81 MIL/uL   Hemoglobin 14.5 13.0 - 17.0 g/dL   HCT 42.1 39.0 - 52.0 %   MCV 88.3 78.0 - 100.0 fL   MCH 30.4 26.0 - 34.0 pg   MCHC 34.4 30.0 - 36.0 g/dL   RDW 13.2 11.5 - 15.5 %   Platelets 307 150 - 400 K/uL  Troponin I     Status: None   Collection Time: 11/06/2016  9:48 AM  Result Value Ref Range   Troponin I <0.03 <0.03 ng/mL  Urinalysis, Routine w reflex microscopic     Status: Abnormal   Collection Time: 10/26/2016 12:43 PM  Result Value Ref Range   Color, Urine AMBER (A) YELLOW    Comment: BIOCHEMICALS MAY BE AFFECTED BY COLOR   APPearance CLOUDY (A) CLEAR   Specific Gravity, Urine 1.025 1.005 - 1.030   pH 5.0 5.0 - 8.0   Glucose, UA NEGATIVE NEGATIVE mg/dL   Hgb urine dipstick NEGATIVE NEGATIVE   Bilirubin Urine SMALL (A) NEGATIVE   Ketones, ur NEGATIVE NEGATIVE mg/dL   Protein, ur 30 (A) NEGATIVE mg/dL   Nitrite NEGATIVE NEGATIVE   Leukocytes, UA NEGATIVE NEGATIVE  Urine microscopic-add on     Status: Abnormal   Collection Time: 11/05/2016 12:43 PM  Result Value Ref Range   Squamous Epithelial / LPF 0-5 (A) NONE SEEN   WBC, UA 0-5 0 - 5 WBC/hpf   RBC / HPF 0-5 0 - 5 RBC/hpf   Bacteria, UA NONE SEEN NONE SEEN    Ct Abdomen Pelvis W Contrast  Result Date: 10/31/2016 CLINICAL DATA:  Upper abdominal pain with nausea and vomiting and diarrhea. EXAM: CT ABDOMEN AND PELVIS WITH CONTRAST TECHNIQUE: Multidetector CT imaging of the abdomen and pelvis was performed using the standard protocol following bolus administration of intravenous contrast. CONTRAST:  155m ISOVUE-300 IOPAMIDOL (ISOVUE-300) INJECTION 61% COMPARISON:  None. FINDINGS: Lower chest:  Unremarkable.  Hepatobiliary: Scattered tiny hypodensities in the liver are too small to characterize. There is no evidence for gallstones, gallbladder wall thickening, or pericholecystic fluid. No intrahepatic or extrahepatic biliary dilation. Pancreas: Pancreas is diffusely edematous and thickened with substantial peripancreatic edema/ inflammation in the anterior para renal space. For enhancement of the pancreatic parenchyma is noted throughout. Spleen: No splenomegaly. No focal mass lesion. Adrenals/Urinary Tract: No adrenal nodule or mass. Tiny hypodensity interpolar right  kidney too small to characterize but likely a cyst. Left kidney unremarkable. No evidence for hydroureter. The urinary bladder appears normal for the degree of distention. Stomach/Bowel: Stomach is nondistended. No gastric wall thickening. No evidence of outlet obstruction. Duodenum is normally positioned as is the ligament of Treitz. No small bowel wall thickening. No small bowel dilatation. The terminal ileum is normal. The appendix is normal. No gross colonic mass. No colonic wall thickening. No substantial diverticular change. Vascular/Lymphatic: There is abdominal aortic atherosclerosis without aneurysm. There is no gastrohepatic or hepatoduodenal ligament lymphadenopathy. No intraperitoneal or retroperitoneal lymphadenopathy. Small lymph nodes are seen in the hepatoduodenal ligament. No pelvic sidewall lymphadenopathy. Portal vein and superior mesenteric vein are patent. Splenic vein is patent. Reproductive: The prostate gland and seminal vesicles have normal imaging features. Penile prosthesis noted. Other: Small volume fluid seen around the liver and spleen, both para colic gutters, and anatomic pelvis. Musculoskeletal: Bone windows reveal no worrisome lytic or sclerotic osseous lesions. IMPRESSION: 1. Diffusely edematous pancreas with substantial peripancreatic edema/inflammation. There is poor enhancement pancreatic parenchyma throughout and  pancreatic necrosis cannot be excluded. No evidence for organized or rim enhancing fluid collection at this time to suggest mature pseudocyst or abscess. No evidence for vascular complication. 2. Small volume intraperitoneal free fluid. 3. No evidence for cholelithiasis. 4. Abdominal aortic atherosclerosis. Electronically Signed   By: Misty Stanley M.D.   On: 11/17/2016 13:02   Dg Chest Port 1 View  Result Date: 10/22/2016 CLINICAL DATA:  Acute pancreatitis.  Shortness of breath. EXAM: PORTABLE CHEST 1 VIEW COMPARISON:  09/02/2009 FINDINGS: 1341 hours. Low lung volumes. Bibasilar atelectasis. No edema or focal airspace consolidation. No substantial pleural effusion. Cardiopericardial silhouette is at upper limits of normal for size. The visualized bony structures of the thorax are intact. Telemetry leads overlie the chest. IMPRESSION: Low volume film with basilar atelectasis. Electronically Signed   By: Misty Stanley M.D.   On: 10/24/2016 13:56               Blood pressure 153/98, pulse (!) 53, temperature 97.8 F (36.6 C), temperature source Oral, resp. rate 17, weight 85.3 kg (188 lb), SpO2 100 %.  Physical exam:   General-- pleasant African-American male with dry heaved ENT-- nonicteric Neck-- supple Heart-- regular rate and rhythm without murmurs are gallops Lungs-- clear Abdomen-- quiet with marked epigastric tenderness Psych-- alert and oriented.   Assessment: 1. Acute pancreatitis. This is his 1st episode and he has markedly elevated lipase. This is most consistent with biliary pancreatitis since he is a nondrinker. The CT is not the best test for demonstrating gallbladder sludge so I think we should go ahead with an ultrasound. We also need to consider elevated triglycerides. 2. Type II diabetes 3. History of congestive heart failure and cardiac stent  Plan: 1. Patient has at least 4/5 positive Ranson's criteria. We will need to treat him aggressively with aggressive IV  fluid replacement and close monitoring. If he has problems with his blood pressure etc. he will need to be moved to the ICU. 2. Standard medications to control pain and nausea. 3. Check triglyceride level 4. Ultrasound gallbladder   Albert Keller,Albert Keller 10/30/2016, 2:54 PM   This note was created using voice recognition software and minor errors may Have occurred unintentionally. Pager: [phone] If no answer or after hours call [phone]

## 2016-10-31 NOTE — Progress Notes (Signed)
Delay in starting IV morphine due to PICC line placement/insertion going on at this time. Will start as soon as line is in.

## 2016-10-31 NOTE — Plan of Care (Signed)
Problem: Health Behavior/Discharge Planning: Goal: Ability to formulate a plan to maintain an alcohol-free life will improve Outcome: Not Applicable Date Met: 21/58/72 Patient and family states the patient does not drink alcohol

## 2016-10-31 NOTE — Consult Note (Signed)
PULMONARY / CRITICAL CARE MEDICINE   Name: Albert Keller MRN: [id number] DOB: 1946/09/08    ADMISSION DATE:  11/13/2016 CONSULTATION DATE:  10/28/2016  REFERRING MD:  Nile Riggs Aggie Moats  CHIEF COMPLAINT:  Abdominal pain  HISTORY OF PRESENT ILLNESS:   70 year old male with multiple medical problems presenting to the hospital with abdominal pain, nausea and vomiting.  Upon evaluating patient, he is breathing 50 times per minute and really unable to provide history.  From chart, abdominal pain/N/V x1 day.  Came to the hospital, admitted to SDU.  CT with early necrotizing pancreatitis, metabolic acidosis, evolving respiratory failure and lactic acidosis.  PCCM consulted for hypotension and non-resolving acidosis.  PAST MEDICAL HISTORY :  He  has a past medical history of Diabetes mellitus without complication ([place]) and Hypertension.  PAST SURGICAL HISTORY: He  has a past surgical history that includes Coronary angioplasty with stent.  Allergies  Allergen Reactions  . Niacin And Related Rash    Makes skin peel and discoloration    No current facility-administered medications on file prior to encounter.    No current outpatient prescriptions on file prior to encounter.    FAMILY HISTORY:  His has no family status information on file.    SOCIAL HISTORY: He  reports that he has never smoked. He has never used smokeless tobacco.  REVIEW OF SYSTEMS:   Unattainable, patient is breathing very fast and evolving respiratory failure.  SUBJECTIVE:  Unable to speak in full sentences due tachypnea.  VITAL SIGNS: BP 106/75   Pulse (!) 113   Temp 98.1 F (36.7 C) (Oral)   Resp (!) 43   Ht 5\' 11"  (1.803 m)   Wt 86.8 kg (191 lb 5.8 oz)   SpO2 (!) 89%   BMI 26.69 kg/m   HEMODYNAMICS:    VENTILATOR SETTINGS:    INTAKE / OUTPUT: No intake/output data recorded.  PHYSICAL EXAMINATION: General:  Chronically ill appearing male, in severe respiratory distress. Neuro:  Awake and  following commands. HEENT:  [place]/AT, PERRL, EOM-I and MMM. Cardiovascular:  Sinus tach, Nl S1/S2, -M/R/G. Lungs:  Decreased BS at the bases. Abdomen:  Soft, diffusely tender (more at epigastrium), ND and MMM. Musculoskeletal:  -edema and -tenderness. Skin:  Intact.  LABS:  BMET  Recent Labs Lab 10/30/2016 0948 11/16/2016 1950  NA 142 138  K 3.3* 7.3*  CL 107 112*  CO2 25 14*  BUN 14 22*  CREATININE 1.48* 2.13*  GLUCOSE 225* 312*    Electrolytes  Recent Labs Lab 11/16/2016 0948 11/08/2016 1612 11/08/2016 1950  CALCIUM 9.8  --  6.8*  MG  --  1.5* 1.5*  PHOS  --  4.4 3.8    CBC  Recent Labs Lab 10/28/2016 0948  WBC 28.0*  HGB 14.5  HCT 42.1  PLT 307    Coag's  Recent Labs Lab 10/26/2016 1612  APTT 26  INR 1.16    Sepsis Markers  Recent Labs Lab 10/23/2016 1400 11/10/2016 1612 11/14/2016 2000  LATICACIDVEN 4.8* 3.7* 6.5*  PROCALCITON 1.57  --   --     ABG  Recent Labs Lab 11/04/2016 1523 11/08/2016 1923  PHART 7.338* 7.277*  PCO2ART 34.7 26.3*  PO2ART 90.6 99.9    Liver Enzymes  Recent Labs Lab 11/04/2016 0948 11/06/2016 1950  AST 264* 504*  ALT 68* 158*  ALKPHOS 49 38  BILITOT 0.8 1.0  ALBUMIN 4.0 2.8*    Cardiac Enzymes  Recent Labs Lab 10/25/2016 0948 11/03/2016 1612 11/02/2016 1950  TROPONINI <0.03 <0.03 0.06*    Glucose  Recent Labs Lab 10/29/2016 1528  GLUCAP 268*    Imaging Ct Abdomen Pelvis W Contrast  Result Date: 10/25/2016 CLINICAL DATA:  Upper abdominal pain with nausea and vomiting and diarrhea. EXAM: CT ABDOMEN AND PELVIS WITH CONTRAST TECHNIQUE: Multidetector CT imaging of the abdomen and pelvis was performed using the standard protocol following bolus administration of intravenous contrast. CONTRAST:  148mL ISOVUE-300 IOPAMIDOL (ISOVUE-300) INJECTION 61% COMPARISON:  None. FINDINGS: Lower chest:  Unremarkable. Hepatobiliary: Scattered tiny hypodensities in the liver are too small to characterize. There is no evidence for  gallstones, gallbladder wall thickening, or pericholecystic fluid. No intrahepatic or extrahepatic biliary dilation. Pancreas: Pancreas is diffusely edematous and thickened with substantial peripancreatic edema/ inflammation in the anterior para renal space. For enhancement of the pancreatic parenchyma is noted throughout. Spleen: No splenomegaly. No focal mass lesion. Adrenals/Urinary Tract: No adrenal nodule or mass. Tiny hypodensity interpolar right kidney too small to characterize but likely a cyst. Left kidney unremarkable. No evidence for hydroureter. The urinary bladder appears normal for the degree of distention. Stomach/Bowel: Stomach is nondistended. No gastric wall thickening. No evidence of outlet obstruction. Duodenum is normally positioned as is the ligament of Treitz. No small bowel wall thickening. No small bowel dilatation. The terminal ileum is normal. The appendix is normal. No gross colonic mass. No colonic wall thickening. No substantial diverticular change. Vascular/Lymphatic: There is abdominal aortic atherosclerosis without aneurysm. There is no gastrohepatic or hepatoduodenal ligament lymphadenopathy. No intraperitoneal or retroperitoneal lymphadenopathy. Small lymph nodes are seen in the hepatoduodenal ligament. No pelvic sidewall lymphadenopathy. Portal vein and superior mesenteric vein are patent. Splenic vein is patent. Reproductive: The prostate gland and seminal vesicles have normal imaging features. Penile prosthesis noted. Other: Small volume fluid seen around the liver and spleen, both para colic gutters, and anatomic pelvis. Musculoskeletal: Bone windows reveal no worrisome lytic or sclerotic osseous lesions. IMPRESSION: 1. Diffusely edematous pancreas with substantial peripancreatic edema/inflammation. There is poor enhancement pancreatic parenchyma throughout and pancreatic necrosis cannot be excluded. No evidence for organized or rim enhancing fluid collection at this time to  suggest mature pseudocyst or abscess. No evidence for vascular complication. 2. Small volume intraperitoneal free fluid. 3. No evidence for cholelithiasis. 4. Abdominal aortic atherosclerosis. Electronically Signed   By: Misty Stanley M.D.   On: 10/29/2016 13:02   Dg Chest Port 1 View  Result Date: 11/14/2016 CLINICAL DATA:  Tachypnea EXAM: PORTABLE CHEST 1 VIEW COMPARISON:  11/11/2016 FINDINGS: There is a right-sided PICC line with the tip projecting over the cavoatrial junction. There is no focal parenchymal opacity. There is no pleural effusion or pneumothorax. The heart and mediastinal contours are unremarkable. The osseous structures are unremarkable. IMPRESSION: There is a right-sided PICC line with the tip projecting over the cavoatrial junction. Electronically Signed   By: Kathreen Devoid   On: 11/02/2016 21:01   Dg Chest Port 1 View  Result Date: 10/24/2016 CLINICAL DATA:  Acute pancreatitis.  Shortness of breath. EXAM: PORTABLE CHEST 1 VIEW COMPARISON:  09/02/2009 FINDINGS: 1341 hours. Low lung volumes. Bibasilar atelectasis. No edema or focal airspace consolidation. No substantial pleural effusion. Cardiopericardial silhouette is at upper limits of normal for size. The visualized bony structures of the thorax are intact. Telemetry leads overlie the chest. IMPRESSION: Low volume film with basilar atelectasis. Electronically Signed   By: Misty Stanley M.D.   On: 10/23/2016 13:56     STUDIES:  CT of the abdomen 11/12>>>necrotizing pancreatitis and some  ascites.  CULTURES: Blood 11/12>>> Urine 11/12>>>  ANTIBIOTICS: Zosyn 11/12>>>11/12 Primaxin 11/12>>>  SIGNIFICANT EVENTS: VDRF 11/12>>>  LINES/TUBES: R PICC 11/12>>> ETT 11/12>>>  DISCUSSION: 70 year old male with necrotizing pancreatitis and now respiratory failure.  Patient likely to get much worse given extent of necrosis.  ASSESSMENT / PLAN:  PULMONARY A: VDRF due to acidosis and evolving ARDS P:   - Intubate - Full  vent support - ABG and CXR now and in AM - Hyperventilate. - 2 amps of bicarb prior to intubation.  CARDIOVASCULAR A:  Sinus tachycardia Sepsis shock P:  - Levophed - CVP - Tele monitoring.  RENAL A:   Acute on chronic renal failure Hyperkalemia Metabolic acidosis P:   - Repeat bmet prior to treatment of hyperkalemia - Fluid resuscitation - Follow CVP - BMET in AM - Replace electrolytes as indicated - Bicarb drip  GASTROINTESTINAL A:   Acute necrotizing pancreatitis P:   - OGT for decompression. - Primaxin - NPO - Lipase and LFTs in AM.  HEMATOLOGIC A:   Leukocytosis P:  - CBC in AM - Hold off transfusion for now.  INFECTIOUS A:   Necrotizing pancreatitis P:   - F/u on cultures. - Primaxin.  ENDOCRINE A:   Hypothyroidism DM   P:   - 1/2 home dose of synthroid - ISS - CBG  NEUROLOGIC A:   AMS due to metabolic disarray  P:   RASS goal: 0 to -2 - Fentanyl drip - Versed PRN   FAMILY  - Updates: No family bedside, patient updated prior to intubation.  - Inter-disciplinary family meet or Palliative Care meeting due by:  11/19   The patient is critically ill with multiple organ systems failure and requires high complexity decision making for assessment and support, frequent evaluation and titration of therapies, application of advanced monitoring technologies and extensive interpretation of multiple databases.   Critical Care Time devoted to patient care services described in this note is  45  Minutes. This time reflects time of care of this signee Dr Jennet Maduro. This critical care time does not reflect procedure time, or teaching time or supervisory time of PA/NP/Med student/Med Resident etc but could involve care discussion time.  Rush Farmer, M.D. [facility] Pulmonary/Critical Care Medicine. Pager: [phone]. After hours pager: [phone].  11/07/2016, 10:21 PM

## 2016-11-01 ENCOUNTER — Inpatient Hospital Stay (HOSPITAL_COMMUNITY): Payer: Medicare Other

## 2016-11-01 ENCOUNTER — Other Ambulatory Visit (HOSPITAL_COMMUNITY): Payer: Medicare Other

## 2016-11-01 DIAGNOSIS — E876 Hypokalemia: Secondary | ICD-10-CM

## 2016-11-01 DIAGNOSIS — I5023 Acute on chronic systolic (congestive) heart failure: Secondary | ICD-10-CM

## 2016-11-01 DIAGNOSIS — K8500 Idiopathic acute pancreatitis without necrosis or infection: Secondary | ICD-10-CM

## 2016-11-01 LAB — CBC WITH DIFFERENTIAL/PLATELET
BASOS ABS: 0 10*3/uL (ref 0.0–0.1)
BASOS PCT: 0 %
EOS ABS: 0 10*3/uL (ref 0.0–0.7)
Eosinophils Relative: 0 %
HCT: 34.8 % — ABNORMAL LOW (ref 39.0–52.0)
Hemoglobin: 10.6 g/dL — ABNORMAL LOW (ref 13.0–17.0)
LYMPHS PCT: 34 %
Lymphs Abs: 2 10*3/uL (ref 0.7–4.0)
MCH: 28.6 pg (ref 26.0–34.0)
MCHC: 30.5 g/dL (ref 30.0–36.0)
MCV: 94.1 fL (ref 78.0–100.0)
MONO ABS: 0.2 10*3/uL (ref 0.1–1.0)
Monocytes Relative: 4 %
NEUTROS ABS: 3.7 10*3/uL (ref 1.7–7.7)
NEUTROS PCT: 62 %
PLATELETS: 134 10*3/uL — AB (ref 150–400)
RBC: 3.7 MIL/uL — ABNORMAL LOW (ref 4.22–5.81)
RDW: 16.6 % — AB (ref 11.5–15.5)
WBC: 5.9 10*3/uL (ref 4.0–10.5)

## 2016-11-01 LAB — PROTIME-INR
INR: 3.2
PROTHROMBIN TIME: 33.5 s — AB (ref 11.4–15.2)

## 2016-11-01 LAB — CBC
HCT: 21 % — ABNORMAL LOW (ref 39.0–52.0)
HEMATOCRIT: 33.7 % — AB (ref 39.0–52.0)
HEMOGLOBIN: 10.8 g/dL — AB (ref 13.0–17.0)
HEMOGLOBIN: 6.7 g/dL — AB (ref 13.0–17.0)
MCH: 29.7 pg (ref 26.0–34.0)
MCH: 29.5 pg (ref 26.0–34.0)
MCHC: 32 g/dL (ref 30.0–36.0)
MCHC: 31.9 g/dL (ref 30.0–36.0)
MCV: 92.6 fL (ref 78.0–100.0)
MCV: 92.5 fL (ref 78.0–100.0)
Platelets: 172 10*3/uL (ref 150–400)
Platelets: 110 10*3/uL — ABNORMAL LOW (ref 150–400)
RBC: 3.64 MIL/uL — ABNORMAL LOW (ref 4.22–5.81)
RBC: 2.27 MIL/uL — ABNORMAL LOW (ref 4.22–5.81)
RDW: 14.4 % (ref 11.5–15.5)
RDW: 14.4 % (ref 11.5–15.5)
WBC: 4.8 10*3/uL (ref 4.0–10.5)
WBC: 3.5 10*3/uL — ABNORMAL LOW (ref 4.0–10.5)

## 2016-11-01 LAB — GLUCOSE, CAPILLARY
GLUCOSE-CAPILLARY: 337 mg/dL — AB (ref 65–99)
GLUCOSE-CAPILLARY: 379 mg/dL — AB (ref 65–99)
GLUCOSE-CAPILLARY: 432 mg/dL — AB (ref 65–99)
Glucose-Capillary: 217 mg/dL — ABNORMAL HIGH (ref 65–99)
Glucose-Capillary: 261 mg/dL — ABNORMAL HIGH (ref 65–99)
Glucose-Capillary: 383 mg/dL — ABNORMAL HIGH (ref 65–99)

## 2016-11-01 LAB — RAPID URINE DRUG SCREEN, HOSP PERFORMED
AMPHETAMINES: NOT DETECTED
BARBITURATES: NOT DETECTED
Benzodiazepines: NOT DETECTED
Cocaine: NOT DETECTED
OPIATES: POSITIVE — AB
TETRAHYDROCANNABINOL: POSITIVE — AB

## 2016-11-01 LAB — BLOOD CULTURE ID PANEL (REFLEXED)
ACINETOBACTER BAUMANNII: NOT DETECTED
Acinetobacter baumannii: NOT DETECTED
CANDIDA ALBICANS: NOT DETECTED
CANDIDA GLABRATA: NOT DETECTED
CANDIDA KRUSEI: NOT DETECTED
CANDIDA KRUSEI: NOT DETECTED
CANDIDA TROPICALIS: NOT DETECTED
CARBAPENEM RESISTANCE: NOT DETECTED
Candida albicans: NOT DETECTED
Candida glabrata: NOT DETECTED
Candida parapsilosis: NOT DETECTED
Candida parapsilosis: NOT DETECTED
Candida tropicalis: NOT DETECTED
ENTEROBACTER CLOACAE COMPLEX: NOT DETECTED
ENTEROBACTERIACEAE SPECIES: DETECTED — AB
ENTEROBACTERIACEAE SPECIES: NOT DETECTED
ENTEROCOCCUS SPECIES: NOT DETECTED
ESCHERICHIA COLI: NOT DETECTED
Enterobacter cloacae complex: NOT DETECTED
Enterococcus species: NOT DETECTED
Escherichia coli: DETECTED — AB
Haemophilus influenzae: NOT DETECTED
Haemophilus influenzae: NOT DETECTED
KLEBSIELLA OXYTOCA: NOT DETECTED
KLEBSIELLA OXYTOCA: NOT DETECTED
KLEBSIELLA PNEUMONIAE: NOT DETECTED
Klebsiella pneumoniae: NOT DETECTED
LISTERIA MONOCYTOGENES: NOT DETECTED
Listeria monocytogenes: NOT DETECTED
NEISSERIA MENINGITIDIS: NOT DETECTED
NEISSERIA MENINGITIDIS: NOT DETECTED
PROTEUS SPECIES: NOT DETECTED
PSEUDOMONAS AERUGINOSA: NOT DETECTED
Proteus species: NOT DETECTED
Pseudomonas aeruginosa: NOT DETECTED
SERRATIA MARCESCENS: NOT DETECTED
STAPHYLOCOCCUS AUREUS BCID: NOT DETECTED
STAPHYLOCOCCUS SPECIES: NOT DETECTED
STREPTOCOCCUS AGALACTIAE: NOT DETECTED
STREPTOCOCCUS AGALACTIAE: NOT DETECTED
STREPTOCOCCUS PNEUMONIAE: NOT DETECTED
STREPTOCOCCUS PYOGENES: NOT DETECTED
STREPTOCOCCUS SPECIES: NOT DETECTED
Serratia marcescens: NOT DETECTED
Staphylococcus aureus (BCID): NOT DETECTED
Staphylococcus species: NOT DETECTED
Streptococcus pneumoniae: NOT DETECTED
Streptococcus pyogenes: NOT DETECTED
Streptococcus species: NOT DETECTED

## 2016-11-01 LAB — COMPREHENSIVE METABOLIC PANEL
ALBUMIN: 1.8 g/dL — AB (ref 3.5–5.0)
ALBUMIN: 1.2 g/dL — AB (ref 3.5–5.0)
ALT: 146 U/L — ABNORMAL HIGH (ref 17–63)
ALT: 429 U/L — ABNORMAL HIGH (ref 17–63)
ANION GAP: 16 — AB (ref 5–15)
AST: 343 U/L — ABNORMAL HIGH (ref 15–41)
AST: 796 U/L — ABNORMAL HIGH (ref 15–41)
Alkaline Phosphatase: 28 U/L — ABNORMAL LOW (ref 38–126)
Alkaline Phosphatase: 24 U/L — ABNORMAL LOW (ref 38–126)
BILIRUBIN TOTAL: 0.6 mg/dL (ref 0.3–1.2)
BILIRUBIN TOTAL: 0.6 mg/dL (ref 0.3–1.2)
BUN: 26 mg/dL — ABNORMAL HIGH (ref 6–20)
BUN: 25 mg/dL — ABNORMAL HIGH (ref 6–20)
CHLORIDE: 103 mmol/L (ref 101–111)
CO2: 13 mmol/L — ABNORMAL LOW (ref 22–32)
CO2: <7 mmol/L — ABNORMAL LOW (ref 22–32)
Calcium: 6.2 mg/dL — CL (ref 8.9–10.3)
Calcium: 7.1 mg/dL — ABNORMAL LOW (ref 8.9–10.3)
Chloride: 112 mmol/L — ABNORMAL HIGH (ref 101–111)
Creatinine, Ser: 3.3 mg/dL — ABNORMAL HIGH (ref 0.61–1.24)
Creatinine, Ser: 3.51 mg/dL — ABNORMAL HIGH (ref 0.61–1.24)
GFR calc Af Amer: 20 mL/min — ABNORMAL LOW (ref >60)
GFR calc Af Amer: 19 mL/min — ABNORMAL LOW (ref >60)
GFR calc non Af Amer: 18 mL/min — ABNORMAL LOW (ref >60)
GFR calc non Af Amer: 16 mL/min — ABNORMAL LOW (ref >60)
GLUCOSE: 718 mg/dL — AB (ref 65–99)
GLUCOSE: 412 mg/dL — AB (ref 65–99)
POTASSIUM: 4.4 mmol/L (ref 3.5–5.1)
POTASSIUM: 4.3 mmol/L (ref 3.5–5.1)
SODIUM: 132 mmol/L — AB (ref 135–145)
SODIUM: 144 mmol/L (ref 135–145)
TOTAL PROTEIN: 3.4 g/dL — AB (ref 6.5–8.1)

## 2016-11-01 LAB — POCT I-STAT 3, ART BLOOD GAS (G3+)
ACID-BASE DEFICIT: 21 mmol/L — AB (ref 0.0–2.0)
BICARBONATE: 7.4 mmol/L — AB (ref 20.0–28.0)
O2 Saturation: 89 %
PH ART: 7.098 — AB (ref 7.350–7.450)
TCO2: 8 mmol/L (ref 0–100)
pCO2 arterial: 24 mmHg — ABNORMAL LOW (ref 32.0–48.0)
pO2, Arterial: 74 mmHg — ABNORMAL LOW (ref 83.0–108.0)

## 2016-11-01 LAB — CK TOTAL AND CKMB (NOT AT ARMC)
CK TOTAL: 1873 U/L — AB (ref 49–397)
CK, MB: 14.4 ng/mL — AB (ref 0.5–5.0)
RELATIVE INDEX: 0.8 (ref 0.0–2.5)

## 2016-11-01 LAB — HEMOGLOBIN A1C
Hgb A1c MFr Bld: 6.1 % — ABNORMAL HIGH (ref 4.8–5.6)
Mean Plasma Glucose: 128 mg/dL

## 2016-11-01 LAB — LIPASE, BLOOD
LIPASE: 1844 U/L — AB (ref 11–51)
Lipase: 2402 U/L — ABNORMAL HIGH (ref 11–51)

## 2016-11-01 LAB — BASIC METABOLIC PANEL
ANION GAP: 14 (ref 5–15)
BUN: 29 mg/dL — ABNORMAL HIGH (ref 6–20)
BUN: 24 mg/dL — AB (ref 6–20)
CALCIUM: 8.3 mg/dL — AB (ref 8.9–10.3)
CALCIUM: 6.3 mg/dL — AB (ref 8.9–10.3)
CO2: 14 mmol/L — AB (ref 22–32)
CREATININE: 2.84 mg/dL — AB (ref 0.61–1.24)
Chloride: 104 mmol/L (ref 101–111)
Chloride: 114 mmol/L — ABNORMAL HIGH (ref 101–111)
Creatinine, Ser: 4.67 mg/dL — ABNORMAL HIGH (ref 0.61–1.24)
GFR calc Af Amer: 25 mL/min — ABNORMAL LOW (ref >60)
GFR, EST AFRICAN AMERICAN: 13 mL/min — AB (ref >60)
GFR, EST NON AFRICAN AMERICAN: 12 mL/min — AB (ref >60)
GFR, EST NON AFRICAN AMERICAN: 21 mL/min — AB (ref >60)
GLUCOSE: 417 mg/dL — AB (ref 65–99)
GLUCOSE: 381 mg/dL — AB (ref 65–99)
POTASSIUM: 7.4 mmol/L — AB (ref 3.5–5.1)
Potassium: 7.4 mmol/L (ref 3.5–5.1)
Sodium: 142 mmol/L (ref 135–145)
Sodium: 143 mmol/L (ref 135–145)

## 2016-11-01 LAB — BLOOD GAS, ARTERIAL
Acid-base deficit: 16.3 mmol/L — ABNORMAL HIGH (ref 0.0–2.0)
BICARBONATE: 10.2 mmol/L — AB (ref 20.0–28.0)
Drawn by: 252031
FIO2: 50
O2 SAT: 94.1 %
PATIENT TEMPERATURE: 98.6
PCO2 ART: 27.5 mmHg — AB (ref 32.0–48.0)
PEEP/CPAP: 5 cmH2O
PO2 ART: 86.9 mmHg (ref 83.0–108.0)
VT: 600 mL
pH, Arterial: 7.196 — CL (ref 7.350–7.450)

## 2016-11-01 LAB — MAGNESIUM
MAGNESIUM: 1.9 mg/dL (ref 1.7–2.4)
Magnesium: 1.7 mg/dL (ref 1.7–2.4)

## 2016-11-01 LAB — ETHANOL: Alcohol, Ethyl (B): 7 mg/dL — ABNORMAL HIGH (ref <5)

## 2016-11-01 LAB — TROPONIN I
TROPONIN I: 0.43 ng/mL — AB (ref <0.03)
Troponin I: 2.1 ng/mL (ref <0.03)

## 2016-11-01 LAB — TRIGLYCERIDES: Triglycerides: 93 mg/dL (ref <150)

## 2016-11-01 LAB — AMYLASE: AMYLASE: 1829 U/L — AB (ref 28–100)

## 2016-11-01 LAB — ABO/RH: ABO/RH(D): O POS

## 2016-11-01 LAB — BILIRUBIN, DIRECT: Bilirubin, Direct: 0.4 mg/dL (ref 0.1–0.5)

## 2016-11-01 LAB — PREPARE RBC (CROSSMATCH)

## 2016-11-01 LAB — LACTIC ACID, PLASMA
LACTIC ACID, VENOUS: 26 mmol/L — AB (ref 0.5–1.9)
LACTIC ACID, VENOUS: 10.3 mmol/L — AB (ref 0.5–1.9)
Lactic Acid, Venous: 15 mmol/L (ref 0.5–1.9)

## 2016-11-01 LAB — PHOSPHORUS
PHOSPHORUS: 3.1 mg/dL (ref 2.5–4.6)
PHOSPHORUS: 7.9 mg/dL — AB (ref 2.5–4.6)

## 2016-11-01 LAB — APTT: APTT: 145 s — AB (ref 24–36)

## 2016-11-01 LAB — CORTISOL: Cortisol, Plasma: 15.2 ug/dL

## 2016-11-01 LAB — PROCALCITONIN: PROCALCITONIN: 24.11 ng/mL

## 2016-11-01 MED ORDER — INSULIN ASPART 100 UNIT/ML ~~LOC~~ SOLN: 8.0000 [IU] | Freq: Once | SUBCUTANEOUS | Status: AC

## 2016-11-01 MED ORDER — VASOPRESSIN 20 UNIT/ML IV SOLN
0.0300 [IU]/min | INTRAVENOUS | Status: DC
  Administered 2016-11-01: 0.03 [IU]/min via INTRAVENOUS
  Filled 2016-11-01: qty 2

## 2016-11-01 MED ORDER — PHENYLEPHRINE HCL 10 MG/ML IJ SOLN
30.0000 ug/min | INTRAVENOUS | Status: DC
  Administered 2016-11-01 (×3): 200 ug/min via INTRAVENOUS
  Filled 2016-11-01 (×3): qty 4

## 2016-11-01 MED ORDER — SODIUM CHLORIDE 0.9 % IV SOLN
500.0000 mg | Freq: Two times a day (BID) | INTRAVENOUS | Status: DC
  Administered 2016-11-01: 500 mg via INTRAVENOUS
  Filled 2016-11-01 (×2): qty 500

## 2016-11-01 MED ORDER — FLUCONAZOLE IN SODIUM CHLORIDE 200-0.9 MG/100ML-% IV SOLN
200.0000 mg | Freq: Every day | INTRAVENOUS | Status: DC
  Administered 2016-11-01: 200 mg via INTRAVENOUS
  Filled 2016-11-01: qty 100

## 2016-11-01 MED ORDER — SODIUM CHLORIDE 0.9 % IV SOLN
Freq: Once | INTRAVENOUS | Status: AC
  Administered 2016-11-01: 12:00:00 via INTRAVENOUS

## 2016-11-01 MED ORDER — AMIODARONE HCL IN DEXTROSE 360-4.14 MG/200ML-% IV SOLN
60.0000 mg/h | INTRAVENOUS | Status: DC
  Administered 2016-11-01: 60 mg/h via INTRAVENOUS

## 2016-11-01 MED ORDER — EPINEPHRINE PF 1 MG/ML IJ SOLN
0.5000 ug/min | INTRAVENOUS | Status: DC
  Administered 2016-11-01: 24 ug/min via INTRAVENOUS
  Administered 2016-11-01: 20 ug/min via INTRAVENOUS
  Filled 2016-11-01: qty 4

## 2016-11-01 MED ORDER — SODIUM POLYSTYRENE SULFONATE 15 GM/60ML PO SUSP
30.0000 g | ORAL | Status: AC
  Administered 2016-11-01: 30 g
  Filled 2016-11-01: qty 120

## 2016-11-01 MED ORDER — INSULIN ASPART 100 UNIT/ML IV SOLN
10.0000 [IU] | INTRAVENOUS | Status: AC
  Administered 2016-11-01: 10 [IU] via INTRAVENOUS

## 2016-11-01 MED ORDER — SODIUM BICARBONATE 8.4 % IV SOLN
INTRAVENOUS | Status: AC
  Filled 2016-11-01: qty 100

## 2016-11-01 MED ORDER — DOPAMINE-DEXTROSE 3.2-5 MG/ML-% IV SOLN
0.0000 ug/kg/min | INTRAVENOUS | Status: DC
Start: 2016-11-01 — End: 2016-11-01
  Administered 2016-11-01: 5 ug/kg/min via INTRAVENOUS

## 2016-11-01 MED ORDER — HEPARIN SODIUM (PORCINE) 1000 UNIT/ML IJ SOLN
3000.0000 [IU] | Freq: Once | INTRAMUSCULAR | Status: AC
  Administered 2016-11-01: 2800 [IU] via INTRAVENOUS

## 2016-11-01 MED ORDER — FAMOTIDINE IN NACL 20-0.9 MG/50ML-% IV SOLN: 20.0000 mg | Freq: Two times a day (BID) | INTRAVENOUS | Status: DC

## 2016-11-01 MED ORDER — CHLORHEXIDINE GLUCONATE 0.12% ORAL RINSE (MEDLINE KIT)
15.0000 mL | Freq: Two times a day (BID) | OROMUCOSAL | Status: DC
  Administered 2016-11-01 (×2): 15 mL via OROMUCOSAL

## 2016-11-01 MED ORDER — PHENYLEPHRINE HCL 10 MG/ML IJ SOLN
30.0000 ug/min | INTRAVENOUS | Status: DC
  Administered 2016-11-01: 200 ug/min via INTRAVENOUS
  Filled 2016-11-01 (×2): qty 1

## 2016-11-01 MED ORDER — EPINEPHRINE PF 1 MG/ML IJ SOLN
0.5000 ug/min | INTRAVENOUS | Status: DC
  Administered 2016-11-01: 2 ug/min via INTRAVENOUS
  Filled 2016-11-01 (×2): qty 4

## 2016-11-01 MED ORDER — NOREPINEPHRINE BITARTRATE 1 MG/ML IV SOLN
2.0000 ug/min | INTRAVENOUS | Status: DC
  Administered 2016-11-01: 34 ug/min via INTRAVENOUS
  Administered 2016-11-01: 50 ug/min via INTRAVENOUS
  Filled 2016-11-01 (×4): qty 16

## 2016-11-01 MED ORDER — SODIUM BICARBONATE 8.4 % IV SOLN
100.0000 meq | Freq: Once | INTRAVENOUS | Status: AC
  Administered 2016-11-01: 100 meq via INTRAVENOUS

## 2016-11-01 MED ORDER — ORAL CARE MOUTH RINSE
15.0000 mL | Freq: Four times a day (QID) | OROMUCOSAL | Status: DC
  Administered 2016-11-01 (×2): 15 mL via OROMUCOSAL

## 2016-11-01 MED ORDER — SODIUM BICARBONATE 8.4 % IV SOLN
50.0000 meq | INTRAVENOUS | Status: AC
  Administered 2016-11-01: 50 meq via INTRAVENOUS
  Filled 2016-11-01: qty 50

## 2016-11-01 MED ORDER — FAMOTIDINE IN NACL 20-0.9 MG/50ML-% IV SOLN
20.0000 mg | INTRAVENOUS | Status: DC
  Filled 2016-11-01: qty 50

## 2016-11-01 MED ORDER — SODIUM CHLORIDE 0.9 % IV SOLN
1.0000 g | INTRAVENOUS | Status: AC
  Administered 2016-11-01: 1 g via INTRAVENOUS
  Filled 2016-11-01: qty 10

## 2016-11-01 MED ORDER — VANCOMYCIN HCL IN DEXTROSE 1-5 GM/200ML-% IV SOLN
1000.0000 mg | INTRAVENOUS | Status: DC
  Administered 2016-11-01: 1000 mg via INTRAVENOUS
  Filled 2016-11-01: qty 200

## 2016-11-01 MED ORDER — DEXTROSE 50 % IV SOLN
1.0000 | INTRAVENOUS | Status: AC
  Administered 2016-11-01: 50 mL via INTRAVENOUS
  Filled 2016-11-01: qty 50

## 2016-11-01 MED ORDER — INSULIN ASPART 100 UNIT/ML ~~LOC~~ SOLN
10.0000 [IU] | Freq: Once | SUBCUTANEOUS | Status: AC
  Administered 2016-11-01: 10 [IU] via INTRAVENOUS

## 2016-11-01 MED ORDER — AMIODARONE LOAD VIA INFUSION
150.0000 mg | Freq: Once | INTRAVENOUS | Status: AC
  Administered 2016-11-01: 150 mg via INTRAVENOUS
  Filled 2016-11-01: qty 83.34

## 2016-11-01 MED FILL — Medication: Qty: 1 | Status: AC

## 2016-11-02 LAB — TYPE AND SCREEN
ABO/RH(D): O POS
Antibody Screen: NEGATIVE
UNIT DIVISION: 0
UNIT DIVISION: 0
UNIT DIVISION: 0
Unit division: 0

## 2016-11-03 LAB — CULTURE, BLOOD (ROUTINE X 2)

## 2016-11-04 ENCOUNTER — Telehealth: Payer: Self-pay

## 2016-11-04 NOTE — Telephone Encounter (Signed)
On 11-25-2016 I received a death certificate from [place]. The death certificate is for burial. The patient is a patient of Doctor Titus Mould. The death certificate will be taken to [facility] (2100) this am for signature.  On 2016/11/25 I received the death certificate back from Doctor Titus Mould. I got the death certificate ready and called the funeral home to let them know the death certificate is ready for pickup.

## 2016-11-19 NOTE — Progress Notes (Signed)
CODE BLUE NOTE  Patient Name: Albert Keller   MRN: [id number]   Date of Birth/ Sex: [birth year] , male      Admission Date: 11/03/2016  Attending Provider: Rush Farmer, MD  Primary Diagnosis: Acute pancreatitis    Indication: Pt was in his usual state of health until this AM, when he was noted to be in [place], [place]. Code blue was subsequently called. At the time of arrival on scene, ACLS protocol was underway.    Technical Description:  - CPR performance duration:  7 minutes  - Was defibrillation or cardioversion used? Yes   - Was external pacer placed? No  - Was patient intubated pre/post CPR? No    Medications Administered: Y = Yes; Blank = No Amiodarone  Y  Atropine    Calcium  Y  Epinephrine  Y  Lidocaine    Magnesium    Norepinephrine  Y  Phenylephrine    Sodium bicarbonate    Vasopressin  Y    Post CPR evaluation:  - Final Status - Was patient successfully resuscitated ? Yes - What is current rhythm? Sinus tachycardia - What is current hemodynamic status? [place]   Miscellaneous Information:  - Labs sent, including: Mg,  Ca, bicarb, BMP  - Primary team notified?  Yes  - Family Notified? Yes  - Additional notes/ transfer status: None        Everrett Coombe, MD  11/20/2016, 3:53 PM

## 2016-11-19 NOTE — Significant Event (Signed)
Rapid Response Event Note RN called for increase work of breathing Overview: Time Called: 1955 Arrival Time: 1958 Event Type: Respiratory  Initial Focused Assessment On arrival pt with labored breathing, diaphoretic, alert, oriented answering all questions appropriately. Manual BP 93/75, HR 119, RR 40-50, 97% RA. Dr. Aggie Moats paged PTA, new orders given and completed. Paged a second time to give blood gas results. Requested a transfer to MICU, order to transfer placed and completed.   Interventions: Assisted with calling admitting MD and transferring pt to 33m04   Event Summary: Name of Physician Notified: Dr. Aggie Moats at 2005  Name of Consulting Physician Notified: PCCM at    Outcome: Transferred (Comment) (64m04)     Gevena Mart, Sela Hua

## 2016-11-19 NOTE — Procedures (Signed)
Arterial Catheter Insertion Procedure Note Albert Keller [id number] 1946-01-12  Procedure: Insertion of Arterial Catheter  Indications: Blood pressure monitoring, Frequent blood sampling and code  Procedure Details Consent: Unable to obtain consent because of emergent medical necessity. Time Out: Verified patient identification, verified procedure, site/side was marked, verified correct patient position, special equipment/implants available, medications/allergies/relevent history reviewed, required imaging and test results available.  Performed  Maximum sterile technique was used including antiseptics, cap, gloves, gown, hand hygiene, mask and sheet. Skin prep: Chlorhexidine; local anesthetic administered 20 gauge catheter was inserted into right femoral artery using the Seldinger technique.  Evaluation Blood flow good; BP tracing good. Complications: No apparent complications.   Albert Keller Nov 09, 2016  [place]  [place] Titus Mould, MD, [place] Pgr: [place] [facility]monary & Critical Care

## 2016-11-19 NOTE — Progress Notes (Addendum)
Patient asystole and no heart sounds auscultated checked by myself and Roe Coombs.Time of Death 12:48. .Family at bedside.Dr Titus Mould informed and in to talk with family

## 2016-11-19 NOTE — Progress Notes (Signed)
Albert Keller 9:25 AM  Subjective: Patient's hospital computer chart reviewed and case discussed with my partner Dr. Oletta Lamas and currently intubated in Trendelenburg with multiple family members around the bedside  Objective: Blood pressure Low tachycardic not examined today hemoglobin significant drop increased creatinine lipase and liver tests slight decrease white count normal  Assessment: Severe pancreatitis probably biliary origin  Plan: Allow critical care to manage care and will be available and follow at a distance to see if we could be of any help and please call if any specific question we could help with  [facility] E  Pager [phone] After 5PM or if no answer call [phone]

## 2016-11-19 NOTE — Progress Notes (Signed)
Pharmacy Antibiotic Note  Albert Keller is a 70 y.o. male admitted on 11/03/2016 with intra-abdominal infection.  Pharmacy has been consulted for Vancomycin dosing (already on Primaxin). Noted progressively worsening renal function.   Plan: -Vancomycin 1000 mg IV q24h, will need to decrease if renal function continues to worsen -Drug levels as indicated   Height: 5\' 11"  (180.3 cm) Weight: 191 lb 5.8 oz (86.8 kg) IBW/kg (Calculated) : 75.3  Temp (24hrs), Avg:97.9 F (36.6 C), Min:97.8 F (36.6 C), Max:98.1 F (36.7 C)   Recent Labs Lab 11/03/2016 0948 10/26/2016 1400 11/05/2016 1612 11/07/2016 1950 10/31/16 2000 10/31/16 2343 11-23-16 0403  WBC 28.0*  --   --   --   --   --  4.8  CREATININE 1.48*  --   --  2.13*  --  2.84*  --   LATICACIDVEN  --  4.8* 3.7*  --  6.5* 10.3*  --     Estimated Creatinine Clearance: 26.1 mL/min (by C-G formula based on SCr of 2.84 mg/dL (H)).    Allergies  Allergen Reactions  . Niacin And Related Rash    Makes skin peel and discoloration     Narda Bonds 23-Nov-2016 4:32 AM

## 2016-11-19 NOTE — Procedures (Signed)
ACLS Called to unit for code blue VT noted ACLS followed and treated empiric hyperkalemia Dc  Epi if this caused Levo increase  able to resus to SR Placed a line Placed HD cath for access  And possible treatment k   Updated wife I have had extensive discussions with family . We discussed patients current circumstances and organ failures. We also discussed patient's prior wishes under circumstances such as this. Family has decided to NOT perform resuscitation if arrest but to continue current medical support for now. Albert Keller. Titus Mould, MD, [place] Pgr: [place] [facility] & Critical [facility]

## 2016-11-19 NOTE — Progress Notes (Signed)
[facility] Progress Note Patient Name: Albert Keller DOB: [birth year] MRN: [id number]   Date of Service  11/23/16  HPI/Events of Note  CBG 320 mg%. Pt with AKI.   eICU Interventions  Will give insulin sliding scale.  Observe CBG >> may need insulin drip if cbg persistently high.      Intervention Category Intermediate Interventions: Other:  [place] Nov 23, 2016, 3:43 AM

## 2016-11-19 NOTE — Procedures (Signed)
Central Venous Catheter Insertion Procedure Note Albert Keller [id number] Apr 08, 1946  Procedure: Insertion of Central Venous Catheter Indications: Assessment of intravascular volume, Drug and/or fluid administration, Frequent blood sampling and possibel HD  Procedure Details Consent: Unable to obtain consent because of emergent medical necessity. Time Out: Verified patient identification, verified procedure, site/side was marked, verified correct patient position, special equipment/implants available, medications/allergies/relevent history reviewed, required imaging and test results available.  Performed  Maximum sterile technique was used including antiseptics, cap, gloves, gown, hand hygiene, mask and sheet. Skin prep: Chlorhexidine; local anesthetic administered A antimicrobial bonded/coated triple lumen catheter was placed in the right femoral vein due to patient being a dialysis patient using the Seldinger technique.  Evaluation Blood flow good Complications: No apparent complications Patient did tolerate procedure well. Chest X-ray ordered to verify placement.  CXR: normal.  Albert Keller. November 27, 2016, 8:25 AM  [place]  Albert Keller. Titus Mould, MD, [place] Pgr: [place] [facility]

## 2016-11-19 NOTE — Progress Notes (Signed)
   11/25/16 0845  Clinical Encounter Type  Visited With Patient and family together  Visit Type Follow-up;Spiritual support  Referral From Chaplain  Consult/Referral To Chaplain  Spiritual Encounters  Spiritual Needs Emotional  Stress Factors  Patient Stress Factors Health changes  Family Stress Factors Major life changes  Chaplain extended spiritual support to family, wife, daughters appeared to bedside as well, this is a follow-up with the family, expecting family pastor as well, advised family if they need further assistance from pastoral care to contact Spiritual Wellness department.  [employer] [phone]

## 2016-11-19 NOTE — Progress Notes (Signed)
Pt's BP 75/63 and maxed out on current pressors. CVP 6. Dr. Murlean Iba made aware.

## 2016-11-19 NOTE — Progress Notes (Signed)
Spiritual care  support was requested for the family of the patient who had received a critical medical status on the patient. Chaplain spoke to the wife who was upset having received the news, and offered words of encouragement and prayer of healing on the patient's behalf. Chaplain will make a referral to the assigned unit chaplain for further spiritual care support Chaplain Yaakov Guthrie [phone]

## 2016-11-19 NOTE — Progress Notes (Signed)
Patient was disconnected from ventilator due to passing.

## 2016-11-19 NOTE — Progress Notes (Signed)
Approximately 180 mls fentanyl wasted and witnessed by Juliane Lack

## 2016-11-19 NOTE — Progress Notes (Signed)
[place] Progress Note Patient Name: Albert Keller DOB: 11/23/1946 MRN: [id number]   Date of Service  2016-11-12  HPI/Events of Note  BP still low despite Levophed at 40 mcg/kg/min and vasopressin.  Currently, 75/63, HR 116, RR 30, 97%  eICU Interventions  Start epinephrine drip     Intervention Category Major Interventions: Other:  [place] 12-Nov-2016, 3:10 AM

## 2016-11-19 NOTE — Progress Notes (Signed)
PHARMACY - PHYSICIAN COMMUNICATION CRITICAL VALUE ALERT - BLOOD CULTURE IDENTIFICATION (BCID)  Results for orders placed or performed during the hospital encounter of 10/30/2016  Blood Culture ID Panel (Reflexed) (Collected: 10/31/2016  2:00 PM)  Result Value Ref Range   Enterococcus species NOT DETECTED NOT DETECTED   Listeria monocytogenes NOT DETECTED NOT DETECTED   Staphylococcus species NOT DETECTED NOT DETECTED   Staphylococcus aureus NOT DETECTED NOT DETECTED   Streptococcus species NOT DETECTED NOT DETECTED   Streptococcus agalactiae NOT DETECTED NOT DETECTED   Streptococcus pneumoniae NOT DETECTED NOT DETECTED   Streptococcus pyogenes NOT DETECTED NOT DETECTED   Acinetobacter baumannii NOT DETECTED NOT DETECTED   Enterobacteriaceae species DETECTED (A) NOT DETECTED   Enterobacter cloacae complex NOT DETECTED NOT DETECTED   Escherichia coli DETECTED (A) NOT DETECTED   Klebsiella oxytoca NOT DETECTED NOT DETECTED   Klebsiella pneumoniae NOT DETECTED NOT DETECTED   Proteus species NOT DETECTED NOT DETECTED   Serratia marcescens NOT DETECTED NOT DETECTED   Carbapenem resistance NOT DETECTED NOT DETECTED   Haemophilus influenzae NOT DETECTED NOT DETECTED   Neisseria meningitidis NOT DETECTED NOT DETECTED   Pseudomonas aeruginosa NOT DETECTED NOT DETECTED   Candida albicans NOT DETECTED NOT DETECTED   Candida glabrata NOT DETECTED NOT DETECTED   Candida krusei NOT DETECTED NOT DETECTED   Candida parapsilosis NOT DETECTED NOT DETECTED   Candida tropicalis NOT DETECTED NOT DETECTED    Name of physician (or Provider) Contacted: D. Feinstein  1/2 blood cx with GNRs. BCID detected E. Coli. Currently on vancomycin and imipenem. Would recommend to de-escalate abx to ceftriaxone. ICU pharmacist to bring up on rounds.  Changes to prescribed antibiotics required: De-escalate to ceftriaxone?  Elenor Quinones, PharmD, BCPS Clinical Pharmacist Pager [phone] 11-11-16 9:25 AM

## 2016-11-19 NOTE — Consult Note (Addendum)
PULMONARY / CRITICAL CARE MEDICINE   Name: Albert Keller MRN: [id number] DOB: [birth year]    ADMISSION DATE:  11/17/2016 CONSULTATION DATE:  10/28/2016  REFERRING MD:  Nile Riggs Aggie Moats  CHIEF COMPLAINT:  Abdominal pain  HISTORY OF PRESENT ILLNESS:   70 year old male with multiple medical problems presenting to the hospital with abdominal pain, nausea and vomiting.  Upon evaluating patient, he is breathing 50 times per minute and really unable to provide history.  From chart, abdominal pain/N/V x1 day.  Came to the hospital, admitted to SDU.  CT with early necrotizing pancreatitis, metabolic acidosis, evolving respiratory failure and lactic acidosis.  PCCM consulted for hypotension and non-resolving acidosis.  SUBJECTIVE:  Code this am  LActic 15 Anemia noted maxed efforts  VITAL SIGNS: BP (!) 85/52   Pulse 81   Temp 97.7 F (36.5 C) (Axillary)   Resp (!) 35   Ht 5\' 11"  (1.803 m)   Wt 93.7 kg (206 lb 9.1 oz)   SpO2 96%   BMI 28.81 kg/m   HEMODYNAMICS: CVP:  [6 mmHg-7 mmHg] 6 mmHg  VENTILATOR SETTINGS: Vent Mode: PRVC FiO2 (%):  [50 %-100 %] 50 % Set Rate:  [35 bmp] 35 bmp Vt Set:  [600 mL] 600 mL PEEP:  [5 cmH20] 5 cmH20 Plateau Pressure:  [20 cmH20-25 cmH20] 20 cmH20  INTAKE / OUTPUT: I/O last 3 completed shifts: In: 3738.3 [I.V.:2528.3; IV Piggyback:1210] Out: 310 [Urine:310]  PHYSICAL EXAMINATION: General:  coding Neuro:  Not responsive post arrest, per 3 mm HEENT:  jvd wnl Cardiovascular:  Sinus tach, Nl S1/S2, -M/R/G. Lungs:  ronchi Abdomen:  Soft, distended, no BS, no bruising, no r Musculoskeletal:  Increasing edema and -tenderness. Skin:  Intact.  LABS:  BMET  Recent Labs Lab 11/17/2016 2343 2016/11/22 0403 2016-11-22 0825  NA 142 132* 144  K 7.4* 4.4 4.3  CL 114* 103 112*  CO2 14* 13* <7*  BUN 24* 26* 25*  CREATININE 2.84* 3.30* 3.51*  GLUCOSE 381* 718* 412*    Electrolytes  Recent Labs Lab 10/29/2016 1950 11/08/2016 2343 2016-11-22 0403  11/22/16 0804 11-22-2016 0825  CALCIUM 6.8* 6.3* 6.2*  --  7.1*  MG 1.5*  --  1.7 1.9  --   PHOS 3.8  --  3.1 7.9*  --     CBC  Recent Labs Lab 11/06/2016 0948 November 22, 2016 0403 11/22/2016 0825  WBC 28.0* 4.8 3.5*  HGB 14.5 10.8* 6.7*  HCT 42.1 33.7* 21.0*  PLT 307 172 110*    Coag's  Recent Labs Lab 10/21/2016 1612  APTT 26  INR 1.16    Sepsis Markers  Recent Labs Lab 11/18/2016 1400  11/11/2016 2000 11/02/2016 2343 11/22/16 0825  LATICACIDVEN 4.8*  < > 6.5* 10.3* 15.0*  PROCALCITON 1.57  --   --   --  24.11  < > = values in this interval not displayed.  ABG  Recent Labs Lab 11/06/2016 2337 11-22-16 0346 Nov 22, 2016 0822  PHART 7.210* 7.196* 7.098*  PCO2ART 29.0* 27.5* 24.0*  PO2ART 287.0* 86.9 74.0*    Liver Enzymes  Recent Labs Lab 11/06/2016 1950 11/22/2016 0403 11/22/16 0825  AST 504* 343* 796*  ALT 158* 146* 429*  ALKPHOS 38 28* 24*  BILITOT 1.0 0.6 0.6  ALBUMIN 2.8* 1.8* 1.2*    Cardiac Enzymes  Recent Labs Lab 10/21/2016 1612 10/22/2016 1950 11-22-2016 0403  TROPONINI <0.03 0.06* 0.43*    Glucose  Recent Labs Lab 11/02/2016 1528  GLUCAP 268*    Imaging Ct  Abdomen Pelvis W Contrast  Result Date: 11/10/2016 CLINICAL DATA:  Upper abdominal pain with nausea and vomiting and diarrhea. EXAM: CT ABDOMEN AND PELVIS WITH CONTRAST TECHNIQUE: Multidetector CT imaging of the abdomen and pelvis was performed using the standard protocol following bolus administration of intravenous contrast. CONTRAST:  171mL ISOVUE-300 IOPAMIDOL (ISOVUE-300) INJECTION 61% COMPARISON:  None. FINDINGS: Lower chest:  Unremarkable. Hepatobiliary: Scattered tiny hypodensities in the liver are too small to characterize. There is no evidence for gallstones, gallbladder wall thickening, or pericholecystic fluid. No intrahepatic or extrahepatic biliary dilation. Pancreas: Pancreas is diffusely edematous and thickened with substantial peripancreatic edema/ inflammation in the anterior para  renal space. For enhancement of the pancreatic parenchyma is noted throughout. Spleen: No splenomegaly. No focal mass lesion. Adrenals/Urinary Tract: No adrenal nodule or mass. Tiny hypodensity interpolar right kidney too small to characterize but likely a cyst. Left kidney unremarkable. No evidence for hydroureter. The urinary bladder appears normal for the degree of distention. Stomach/Bowel: Stomach is nondistended. No gastric wall thickening. No evidence of outlet obstruction. Duodenum is normally positioned as is the ligament of Treitz. No small bowel wall thickening. No small bowel dilatation. The terminal ileum is normal. The appendix is normal. No gross colonic mass. No colonic wall thickening. No substantial diverticular change. Vascular/Lymphatic: There is abdominal aortic atherosclerosis without aneurysm. There is no gastrohepatic or hepatoduodenal ligament lymphadenopathy. No intraperitoneal or retroperitoneal lymphadenopathy. Small lymph nodes are seen in the hepatoduodenal ligament. No pelvic sidewall lymphadenopathy. Portal vein and superior mesenteric vein are patent. Splenic vein is patent. Reproductive: The prostate gland and seminal vesicles have normal imaging features. Penile prosthesis noted. Other: Small volume fluid seen around the liver and spleen, both para colic gutters, and anatomic pelvis. Musculoskeletal: Bone windows reveal no worrisome lytic or sclerotic osseous lesions. IMPRESSION: 1. Diffusely edematous pancreas with substantial peripancreatic edema/inflammation. There is poor enhancement pancreatic parenchyma throughout and pancreatic necrosis cannot be excluded. No evidence for organized or rim enhancing fluid collection at this time to suggest mature pseudocyst or abscess. No evidence for vascular complication. 2. Small volume intraperitoneal free fluid. 3. No evidence for cholelithiasis. 4. Abdominal aortic atherosclerosis. Electronically Signed   By: Misty Stanley M.D.   On:  11/02/2016 13:02   Portable Chest 1 View  Result Date: Nov 18, 2016 CLINICAL DATA:  Acute pancreatitis EXAM: PORTABLE CHEST 1 VIEW COMPARISON:  Yesterday FINDINGS: Endotracheal tube tip between the clavicular heads and carina. An orogastric tube reaches the stomach. Right upper extremity PICC with tip at the SVC level. Low volume chest with streaky basilar opacities. Stable heart size and aortic contours, distorted by leftward rotation. Coronary stent noted. No edema, effusion, or pneumothorax. IMPRESSION: Improved positioning of endotracheal tube. Unremarkable orogastric tube and central line. Increased basilar atelectasis. Electronically Signed   By: Monte Fantasia M.D.   On: 2016/11/18 07:11   Dg Chest Port 1 View  Result Date: 10/22/2016 CLINICAL DATA:  Intubated. EXAM: PORTABLE CHEST 1 VIEW COMPARISON:  11/15/2016 at 20:06 FINDINGS: Endotracheal tube tip is in the right mainstem bronchus. Nasogastric tube extends well into the stomach and beyond the inferior edge of the image. Right upper extremity PICC line appears satisfactorily positioned with tip at the expected location of the cavoatrial junction. Mild streaky lung base opacities, right greater than left, probably accentuated by shallow degree of inspiration. No pneumothorax. IMPRESSION: Right mainstem intubation. These results were called by telephone at the time of interpretation on 11/13/2016 at 11:13 pm to Dr. Jennet Maduro , who verbally acknowledged these  results. Electronically Signed   By: Andreas Newport M.D.   On: 11/16/2016 23:14   Dg Chest Port 1 View  Result Date: 11/11/2016 CLINICAL DATA:  Tachypnea EXAM: PORTABLE CHEST 1 VIEW COMPARISON:  11/09/2016 FINDINGS: There is a right-sided PICC line with the tip projecting over the cavoatrial junction. There is no focal parenchymal opacity. There is no pleural effusion or pneumothorax. The heart and mediastinal contours are unremarkable. The osseous structures are unremarkable.  IMPRESSION: There is a right-sided PICC line with the tip projecting over the cavoatrial junction. Electronically Signed   By: Kathreen Devoid   On: 11/12/2016 21:01   Dg Chest Port 1 View  Result Date: 10/27/2016 CLINICAL DATA:  Acute pancreatitis.  Shortness of breath. EXAM: PORTABLE CHEST 1 VIEW COMPARISON:  09/02/2009 FINDINGS: 1341 hours. Low lung volumes. Bibasilar atelectasis. No edema or focal airspace consolidation. No substantial pleural effusion. Cardiopericardial silhouette is at upper limits of normal for size. The visualized bony structures of the thorax are intact. Telemetry leads overlie the chest. IMPRESSION: Low volume film with basilar atelectasis. Electronically Signed   By: Misty Stanley M.D.   On: 10/31/2016 13:56     STUDIES:  CT of the abdomen 11/12>>>necrotizing pancreatitis and some ascites.  CULTURES: Blood 11/12>>>e coli Urine 11/12>>>  ANTIBIOTICS: Zosyn 11/12>>>11/12 Primaxin 11/12>>> vanc 11/13>>>11/13  SIGNIFICANT EVENTS: VDRF 11/12>>>  LINES/TUBES: R PICC 11/12>>> ETT 11/12>>>  DISCUSSION: 70 year old male with necrotizing pancreatitis and now respiratory failure.  Patient likely to get much worse given extent of necrosis.  ASSESSMENT / PLAN:  PULMONARY A: VDRF due to acidosis and evolving ARDS Uncompensated metabolic acidosis P:   - ABG stat post arrest Keeping high rate / MV on vent abg repeat Lactic No sbt  CARDIOVASCULAR A:  Sinus tachycardia Sepsis shock R/o hemorrhagic shock S/p VF arrest P:  - Levophed is maxed 50 - CVP to goaL 12 - Tele monitoring -CORTISOL Lactic Added dop for brady slight -epi dc;ed for concern inciting VF - concern dead bowel, dc vaso or at least no increase -amio then dced as drop in HR -2 untis STAT prbc  RENAL A:   Acute on chronic renal failure Hyperkalemia improved Metabolic acidosis Refractory lactic acidosis P:   - Fluid resuscitation - Follow CVP, to goal 12, for prbc first then  likely crytsalloids - BMET q6h - Replace electrolytes as indicated - Bicarb drip for K   GASTROINTESTINAL A:   Acute necrotizing pancreatitis R/o complicating features, hemorrhagic conversion pancrease, ischemic bowel, r/o bowel interuption Poor prognosis P:   - OGT for decompression. - Primaxin - NPO - Lipase and LFTs in AM -if, ph, BP and status can improve would CT abdo / surgical consult, this does not look survivable   HEMATOLOGIC A:   Anemia, r/o hemohgaic transformation pancrease P:  - CBC stat noted, stat blood wrotten - resend coags -scd  INFECTIOUS A:   Necrotizing pancreatitis, E coli bacteremia P:   - F/u on cultures -empiric diflucan - Primaxin maintain -CT if can stabalize vs surgical consult  ENDOCRINE A:   Hypothyroidism DM   P:   - 1/2 home dose of synthroid - ISS - CBG -stat cortisol  NEUROLOGIC A:   AMS due to metabolic disarray  Anoxia concerns, post arrest P:   RASS goal: 0 to -2 - Fentanyl drip - Versed PRN -not TTM candidate bacteremia, sepsis  FAMILY  - Updates: I personally updated daughters and wife in room, extensive, showed films , told  of labs, and poor prognosis for survival. HD, heroics will not change outcome. Following labs and continued aggressive management medically, not able to even keep BP up with maxed pressors. Not a surgical candidate at this stage, and unlikely.  - Inter-disciplinary family meet or Palliative Care meeting due by:  Done DF 11/13  I have had extensive discussions with family wife and kids. We discussed patients current circumstances and organ failures. We also discussed patient's prior wishes under circumstances such as this. Family has decided to NOT perform resuscitation if arrest but to continue current medical support for now. They also do not want anything done to him that would not change outcome  Ccm time 90 min   Lavon Paganini. Titus Mould, MD, [place] Pgr: [facility]

## 2016-11-19 NOTE — Progress Notes (Signed)
Wasted 19 cc morphine in sink from PCA pump with Mindy RN.

## 2016-11-19 NOTE — Progress Notes (Signed)
[facility] Progress Note Patient Name: FARREN DIMITRIOU DOB: 1946-01-11 MRN: [id number]   Date of Service  11/02/2016  HPI/Events of Note  RN Calls for persistent hyperkalemia and elevated lactate.  Patient admitted for severe necrotizing pancreatitis. Transferred to ICU this afternoon and intubated for hypoxemic respiratory failure.  Patient has received 6.5 L of fluids. Currently on sodium bicarbonate drip at 150 ML's an hour.  Oliguric.  Patient sedated, comfortable. Intubated.  Blood pressure 90/64,  MAP 70, HR 130s, respiratory rate of 30s.    eICU Interventions  Will give medicines to treat hyperkalemia. Telemetry without peaked T waves or prolonged QRS interval.  Repeat potassium in a couple of hours unless with EKG changes.   We'll hold off on fluid boluses as patient has been adequately resuscitated. Currently on Levophed drip. Will add vasopressin.   Cont other meds, abx.      Intervention Category Major Interventions: Electrolyte abnormality - evaluation and management  [place] 2016-11-02, 12:51 AM

## 2016-11-19 NOTE — Progress Notes (Signed)
[facility] Progress Note Patient Name: Albert Keller DOB: Mar 22, 1946 MRN: [id number]   Date of Service  21-Nov-2016  HPI/Events of Note  Nurse calls as family is asking about up-to-date on patient.  Patient known to have diabetes, CAD, admitted with 1 day history of nausea, vomiting.  Progressive clinical deterioration over the last 24 hrs.  CT scan of the abdomen shows severe necrotizing pancreatitis.  Patient now with severe sepsis, septic shock, currently on 3 pressors and blood pressures still marginal.   On epinephrine drip at 20 mcg/m, Levophed drip at 50 mcg/m, vasopressin at 0.03 units per minute. Patient also with oliguria. Last potassium was 7.4 which is treated with medicines.  Lactic acid 10.  Awaiting repeat BMP  Repeat ABG just now: 7.19, 28, 87. 60% FiO2.   Pt seen, sedated. Not in distress.  61/47,  111, 35,  96% on 50% Fio2  eICU Interventions  I extensively discussed with patient's wife and daughter overall critical nature of pt's disease.  Mentioned to them that he is SEVERELY CRITICAL.  Cot vent support. Cont Zosyn and Imipenem. Will add Vanc and fluconazole to cover for other organisms potentially. Awaiting rpt BMP > likely will need CVVH.  Will give 2 amps NaHCo3. Cont naHCO3 drip.  Increase epi drip to 40 mcg/min (maximum). Cont levophed, vasopressin. Add neosynephrine if still with low BP.  Will need surgical consult but he is too unstable to go to OR at this point.  Check labs >  Lipase, amylase, LFTs, alcohol level, UDS. Cycle troponin     Intervention Category Major Interventions: Hypotension - evaluation and management Evaluation Type: Other  [place] 11/21/2016, 4:38 AM

## 2016-11-19 NOTE — Progress Notes (Signed)
Patient with rhythm changes on monitor SBP 76 Dr Titus Mould paged .Loss of pulse with rhytmn CPR iniated and code  Called at [phone]. Patient's wife at bedside .Chaplain called to bedside.

## 2016-11-19 DEATH — deceased

## 2016-12-20 NOTE — Discharge Summary (Signed)
NAME:  Albert Keller, Albert Keller NO.:  [id number]  MEDICAL RECORD NO.:  [id number]  LOCATION:  2M04C                        FACILITY:  [facility]  PHYSICIAN:  Raylene Miyamoto, MD DATE OF BIRTH:  [birth year]  DATE OF ADMISSION:  11/06/2016 DATE OF DISCHARGE:  11-08-2016                              DISCHARGE SUMMARY   DEATH SUMMARY.  HISTORY/HOSPITAL COURSE:  This is an unfortunate 71 year old male with multiple medical problems, presented to the hospital with abdominal pain, nausea, and vomiting.  Upon evaluation, he was in respiratory failure.  He suffered from nausea, vomiting, x1 day, came to the hospital and admitted to the step-down unit.  CT scan was done, showed necrotizing pancreatitis.  He developed metabolic acidosis with revolving respiratory failure and lactic acidosis.  PCM was consulted secondary to shock and non-resolving acidosis.  The patient unfortunately also additionally suffered a cardiac arrest secondary to multiorgan dysfunction syndrome.  Lactic acid was 15, noted to be anemic with max efforts in terms of pressors and mechanical ventilation.  He was treated aggressively with imipenem and vancomycin.  CT scan of the abdomen done on October 31, 2016, did confirm necrotizing pancreatitis with some ascites and he continued to have refractory shock and acidosis.  The patient was made DNR.  Family did not want aggressive heroic measures.  The patient expired.  FINAL DIAGNOSES UPON DEATH: 1. Acute necrotizing pancreatitis. 2. Rule out complicating feature such as hemorrhagic conversion of     pancreas versus ischemic. 3. Acute respiratory failure. 4. Refractory shock. 5. Refractory lactic acidosis. 6. Acute on chronic renal failure.     Raylene Miyamoto, MD     DJF/MEDQ  D:  12/08/2016  T:  12/09/2016  Job:  [id number]

## 2018-07-20 IMAGING — CR DG CHEST 1V PORT
1 series · 1 of 1 positions shown · non-contrast
Comparison: 10/31/2016 at [DATE]

CLINICAL DATA: Intubated.

EXAM:
PORTABLE CHEST 1 VIEW

[AP]
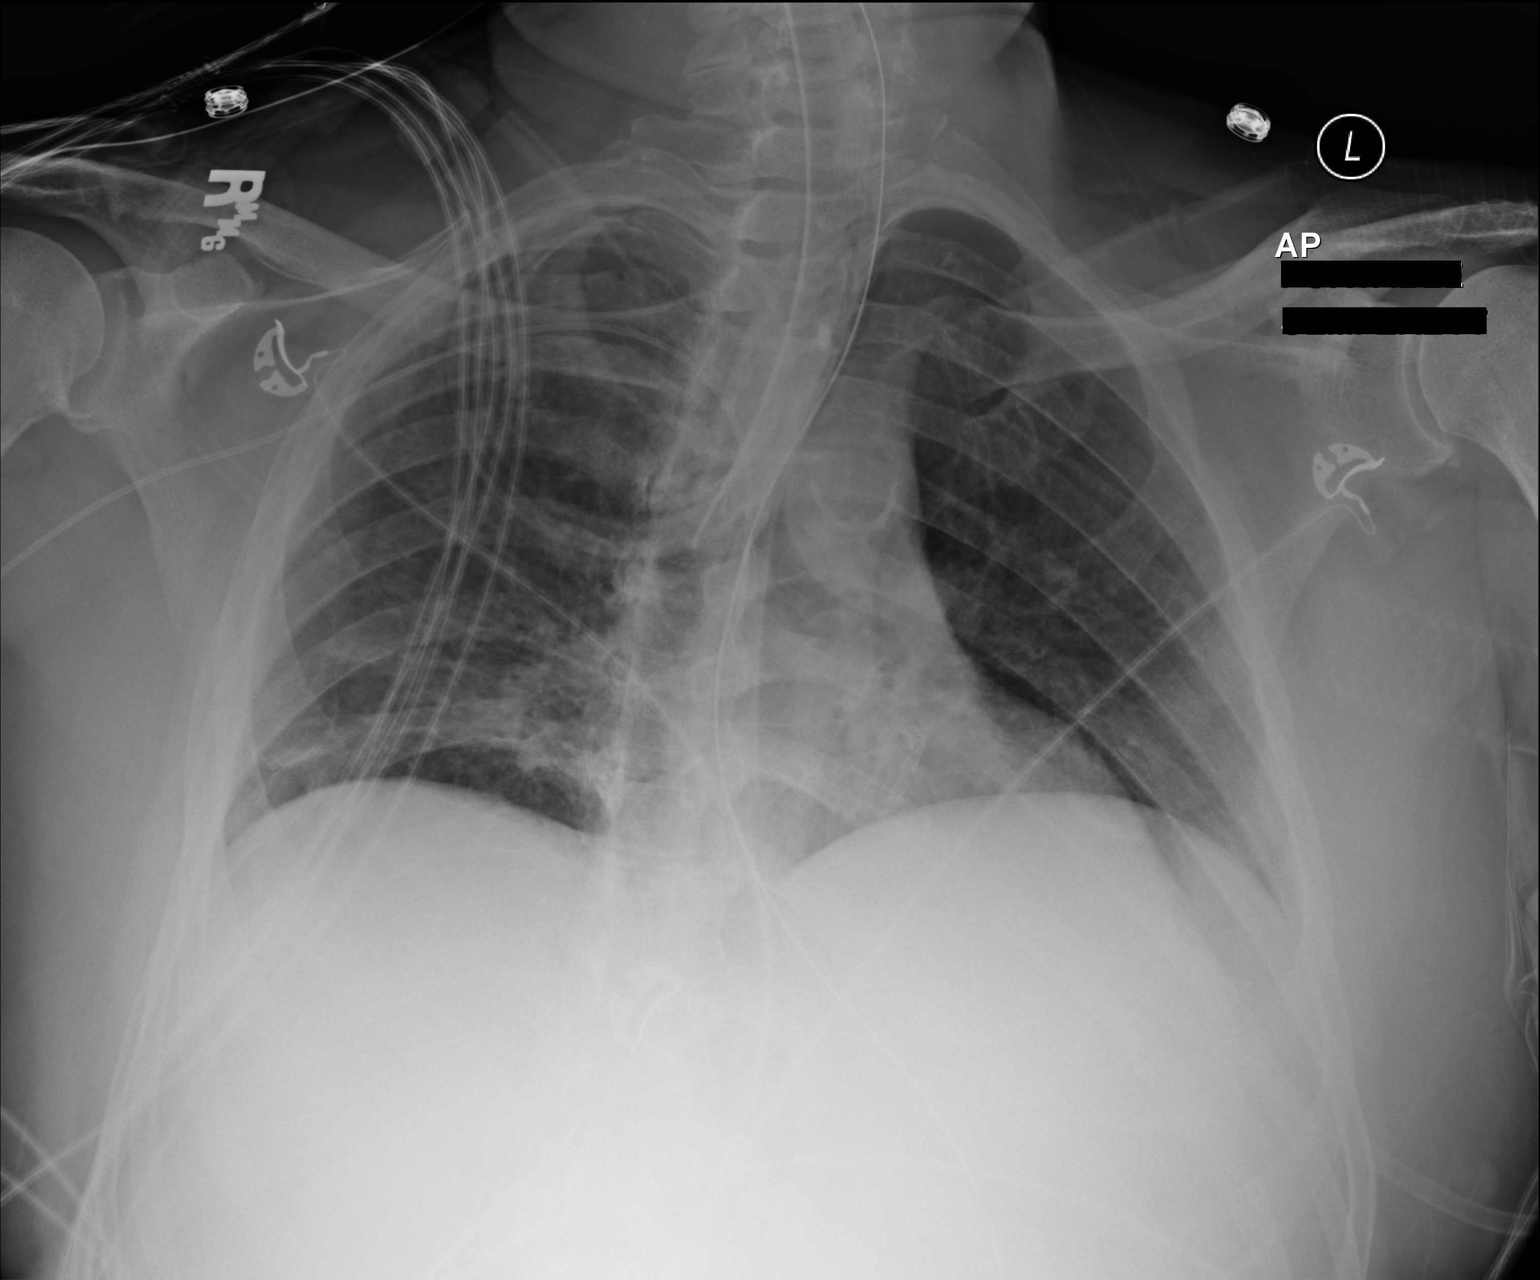

[1 of 1 positions shown; findings below may reference images not displayed]

FINDINGS: Endotracheal tube tip is in the right mainstem bronchus. Nasogastric
tube extends well into the stomach and beyond the inferior edge of
the image. Right upper extremity PICC line appears satisfactorily
positioned with tip at the expected location of the cavoatrial
junction. Mild streaky lung base opacities, right greater than left,
probably accentuated by shallow degree of inspiration. No
pneumothorax.
IMPRESSION: Right mainstem intubation. These results were called by telephone at
the time of interpretation on 10/31/2016 at [DATE] to Dr. TOGATOROP
YEDLA , who verbally acknowledged these results.

## 2018-07-20 IMAGING — CT CT ABD-PELV W/ CM
2 of 5 series · 9 of 46 positions shown, 10 images · IV contrast (Iodine)
Comparison: None.

CLINICAL DATA: Upper abdominal pain with nausea and vomiting and
diarrhea.

EXAM:
CT ABDOMEN AND PELVIS WITH CONTRAST
TECHNIQUE: Multidetector CT imaging of the abdomen and pelvis was performed
using the standard protocol following bolus administration of
intravenous contrast.
CONTRAST:  100mL QSQSAD-CLL IOPAMIDOL (QSQSAD-CLL) INJECTION 61%

[Series 201: routine, idose (2) · axial · 0.78mm/px · z∈[+86,+501]mm · 6 of 107 slices shown, 7 images]
[im 12/107  soft-tissue]
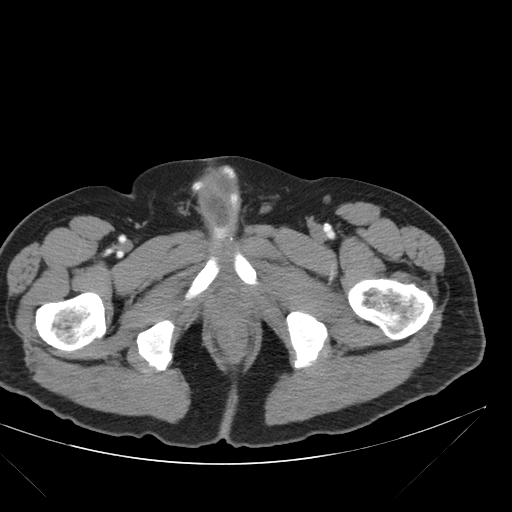
[im 12/107  bone]
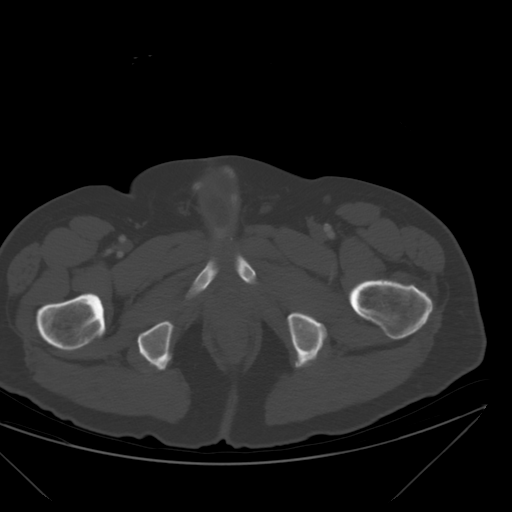
[im 30/107  soft-tissue]
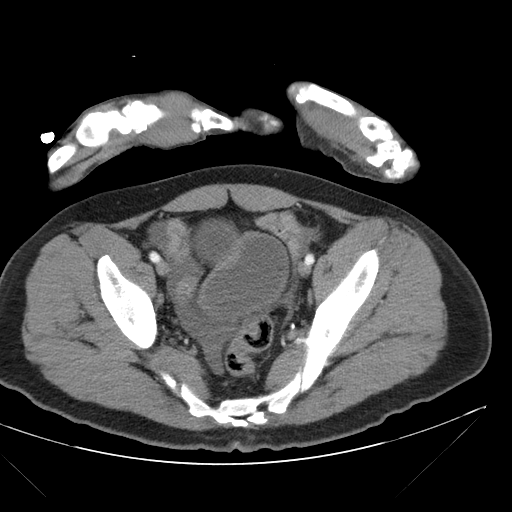
[im 48/107  soft-tissue]
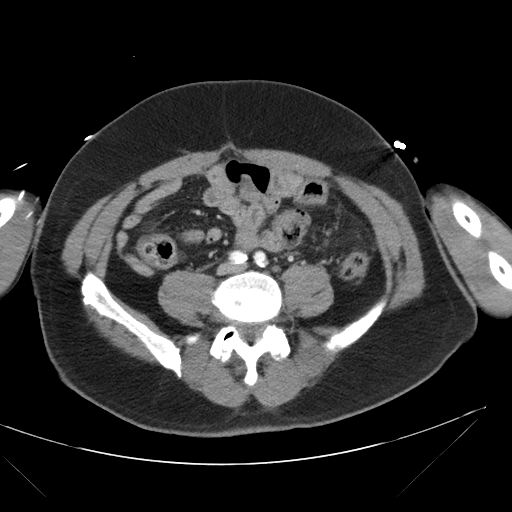
[im 59/107  soft-tissue]
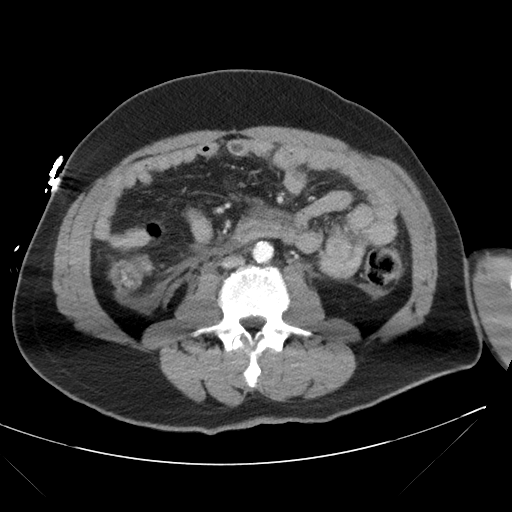
[im 77/107  soft-tissue]
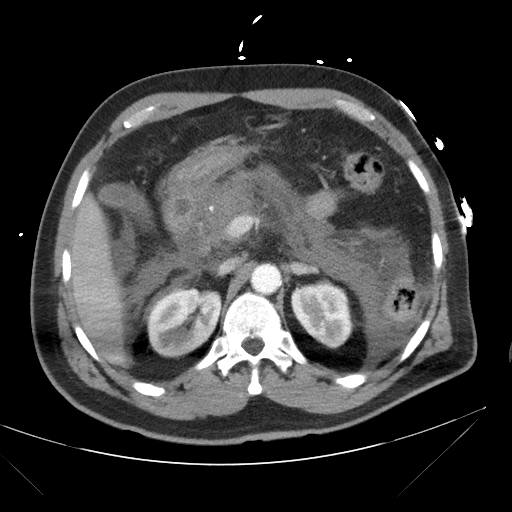
[im 95/107  soft-tissue]
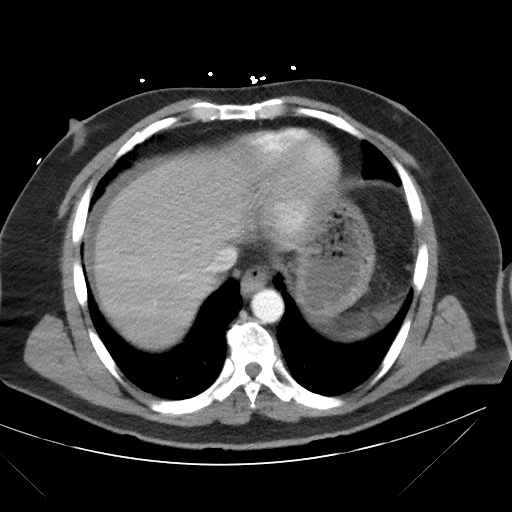

[Series 203: coronals, idose (2) · coronal · 0.45mm/px · 3 of 154 slices shown]
[im 52/154  soft-tissue]
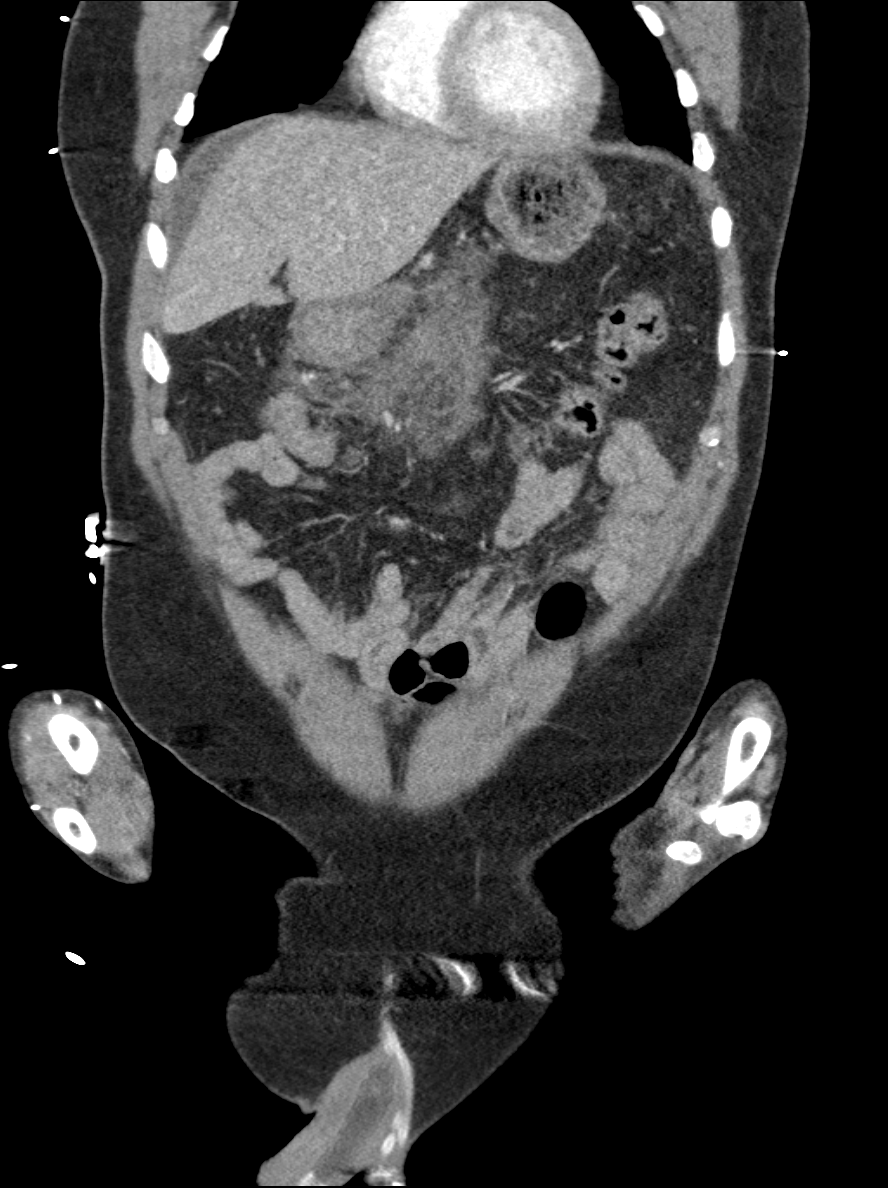
[im 69/154  soft-tissue]
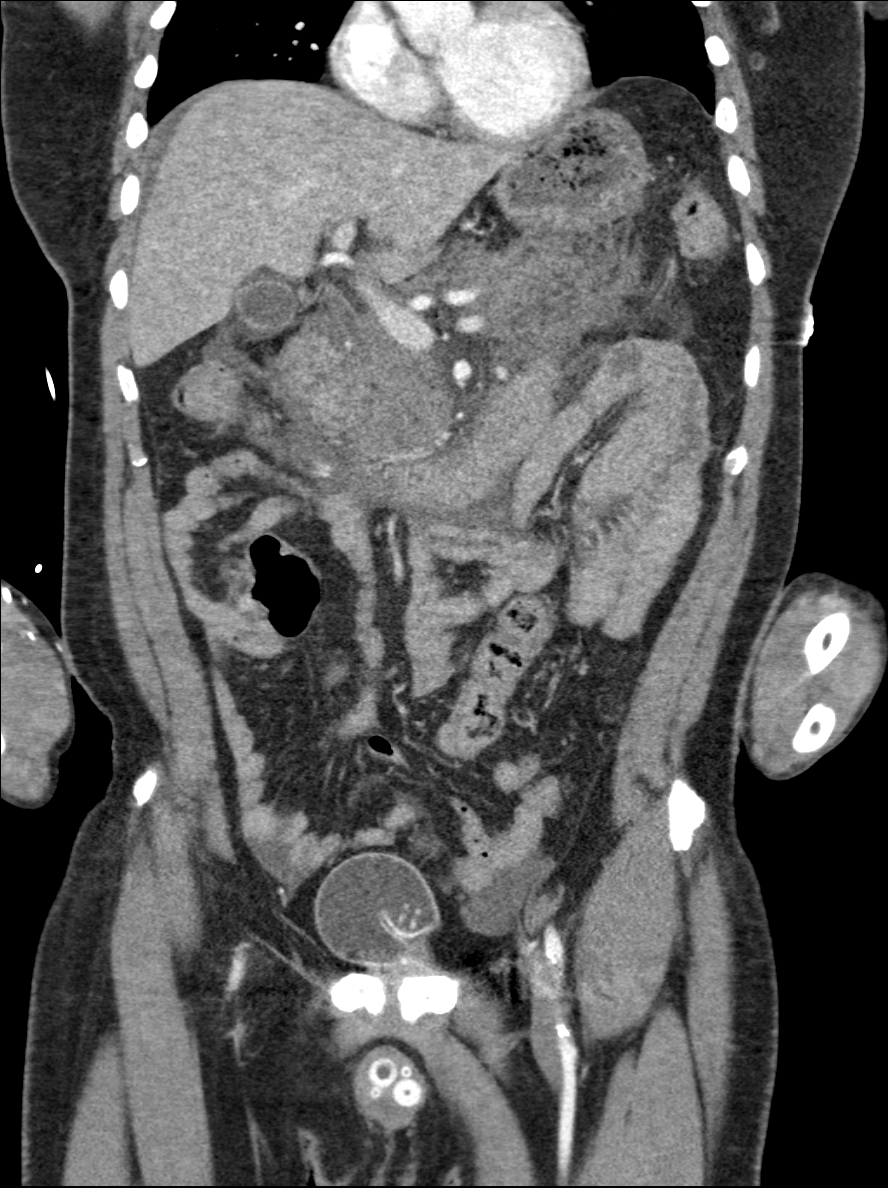
[im 86/154  soft-tissue]
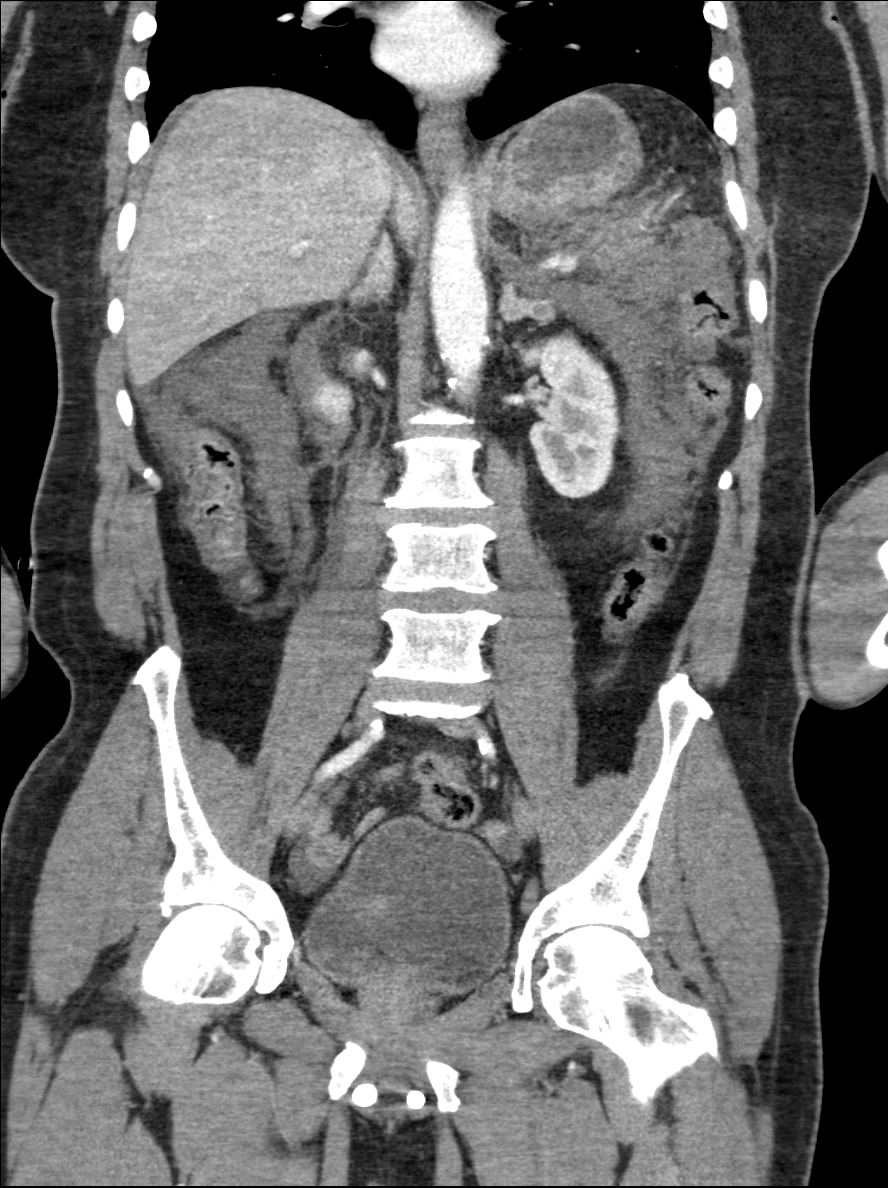

[9 of 46 positions shown; findings below may reference images not displayed]

FINDINGS: Lower chest:  Unremarkable.

Hepatobiliary: Scattered tiny hypodensities in the liver are too
small to characterize. There is no evidence for gallstones,
gallbladder wall thickening, or pericholecystic fluid. No
intrahepatic or extrahepatic biliary dilation.

Pancreas: Pancreas is diffusely edematous and thickened with
substantial peripancreatic edema/ inflammation in the anterior para
renal space. For enhancement of the pancreatic parenchyma is noted
throughout.

Spleen: No splenomegaly. No focal mass lesion.

Adrenals/Urinary Tract: No adrenal nodule or mass. Tiny hypodensity
interpolar right kidney too small to characterize but likely a cyst.
Left kidney unremarkable. No evidence for hydroureter. The urinary
bladder appears normal for the degree of distention.

Stomach/Bowel: Stomach is nondistended. No gastric wall thickening.
No evidence of outlet obstruction. Duodenum is normally positioned
as is the ligament of Treitz. No small bowel wall thickening. No
small bowel dilatation. The terminal ileum is normal. The appendix
is normal. No gross colonic mass. No colonic wall thickening. No
substantial diverticular change.

Vascular/Lymphatic: There is abdominal aortic atherosclerosis
without aneurysm. There is no gastrohepatic or hepatoduodenal
ligament lymphadenopathy. No intraperitoneal or retroperitoneal
lymphadenopathy. Small lymph nodes are seen in the hepatoduodenal
ligament. No pelvic sidewall lymphadenopathy. Portal vein and
superior mesenteric vein are patent. Splenic vein is patent.

Reproductive: The prostate gland and seminal vesicles have normal
imaging features. Penile prosthesis noted.

Other: Small volume fluid seen around the liver and spleen, both
para colic gutters, and anatomic pelvis.

Musculoskeletal: Bone windows reveal no worrisome lytic or sclerotic
osseous lesions.
IMPRESSION: 1. Diffusely edematous pancreas with substantial peripancreatic
edema/inflammation. There is poor enhancement pancreatic parenchyma
throughout and pancreatic necrosis cannot be excluded. No evidence
for organized or rim enhancing fluid collection at this time to
suggest mature pseudocyst or abscess. No evidence for vascular
complication.
2. Small volume intraperitoneal free fluid.
3. No evidence for cholelithiasis.
4. Abdominal aortic atherosclerosis.
